# Patient Record
Sex: Female | Born: 1980 | Race: White | Hispanic: No | Marital: Single | Smoking: Former smoker
Health system: Southern US, Community
[De-identification: ages and names within clinical notes are randomized; demographics above are authoritative.]

## PROBLEM LIST (undated history)

## (undated) DIAGNOSIS — F419 Anxiety disorder, unspecified: Secondary | ICD-10-CM

## (undated) DIAGNOSIS — F1910 Other psychoactive substance abuse, uncomplicated: Secondary | ICD-10-CM

## (undated) DIAGNOSIS — R630 Anorexia: Secondary | ICD-10-CM

## (undated) DIAGNOSIS — K219 Gastro-esophageal reflux disease without esophagitis: Secondary | ICD-10-CM

## (undated) DIAGNOSIS — E701 Other hyperphenylalaninemias: Secondary | ICD-10-CM

## (undated) DIAGNOSIS — R519 Headache, unspecified: Secondary | ICD-10-CM

## (undated) DIAGNOSIS — E700 Classical phenylketonuria: Secondary | ICD-10-CM

## (undated) DIAGNOSIS — N12 Tubulo-interstitial nephritis, not specified as acute or chronic: Secondary | ICD-10-CM

## (undated) DIAGNOSIS — F418 Other specified anxiety disorders: Secondary | ICD-10-CM

## (undated) DIAGNOSIS — B019 Varicella without complication: Secondary | ICD-10-CM

## (undated) DIAGNOSIS — F32A Depression, unspecified: Secondary | ICD-10-CM

## (undated) DIAGNOSIS — F329 Major depressive disorder, single episode, unspecified: Secondary | ICD-10-CM

## (undated) DIAGNOSIS — N200 Calculus of kidney: Secondary | ICD-10-CM

## (undated) DIAGNOSIS — F319 Bipolar disorder, unspecified: Secondary | ICD-10-CM

## (undated) HISTORY — DX: Headache, unspecified: R51.9

## (undated) HISTORY — DX: Calculus of kidney: N20.0

## (undated) HISTORY — DX: Bipolar disorder, unspecified: F31.9

## (undated) HISTORY — DX: Depression, unspecified: F32.A

## (undated) HISTORY — DX: Other psychoactive substance abuse, uncomplicated: F19.10

## (undated) HISTORY — PX: WISDOM TOOTH EXTRACTION: SHX21

## (undated) HISTORY — DX: Tubulo-interstitial nephritis, not specified as acute or chronic: N12

## (undated) HISTORY — DX: Major depressive disorder, single episode, unspecified: F32.9

## (undated) HISTORY — DX: Anxiety disorder, unspecified: F41.9

## (undated) HISTORY — PX: NO PAST SURGERIES: SHX2092

## (undated) HISTORY — DX: Varicella without complication: B01.9

## (undated) HISTORY — DX: Gastro-esophageal reflux disease without esophagitis: K21.9

## (undated) HISTORY — DX: Anorexia: R63.0

## (undated) HISTORY — DX: Other specified anxiety disorders: F41.8

---
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
## 2001-09-24 ENCOUNTER — Other Ambulatory Visit: Admission: RE | Admit: 2001-09-24 | Discharge: 2001-09-24 | Payer: Self-pay | Admitting: Obstetrics and Gynecology

## 2002-01-05 ENCOUNTER — Other Ambulatory Visit: Admission: RE | Admit: 2002-01-05 | Discharge: 2002-01-05 | Payer: Self-pay | Admitting: Obstetrics and Gynecology

## 2002-10-27 ENCOUNTER — Other Ambulatory Visit: Admission: RE | Admit: 2002-10-27 | Discharge: 2002-10-27 | Payer: Self-pay | Admitting: Obstetrics and Gynecology

## 2003-12-08 ENCOUNTER — Emergency Department (HOSPITAL_COMMUNITY): Admission: EM | Admit: 2003-12-08 | Discharge: 2003-12-08 | Payer: Self-pay | Admitting: Family Medicine

## 2003-12-09 ENCOUNTER — Encounter: Admission: RE | Admit: 2003-12-09 | Discharge: 2003-12-09 | Payer: Self-pay | Admitting: Occupational Medicine

## 2006-12-11 ENCOUNTER — Ambulatory Visit: Payer: Self-pay | Admitting: Nurse Practitioner

## 2006-12-11 ENCOUNTER — Ambulatory Visit: Payer: Self-pay | Admitting: Family Medicine

## 2007-01-27 ENCOUNTER — Ambulatory Visit: Payer: Self-pay | Admitting: Family Medicine

## 2007-01-28 ENCOUNTER — Encounter (INDEPENDENT_AMBULATORY_CARE_PROVIDER_SITE_OTHER): Payer: Self-pay | Admitting: Family Medicine

## 2007-01-28 ENCOUNTER — Ambulatory Visit: Payer: Self-pay | Admitting: Internal Medicine

## 2007-01-28 ENCOUNTER — Ambulatory Visit: Payer: Self-pay | Admitting: *Deleted

## 2007-02-13 ENCOUNTER — Ambulatory Visit: Payer: Self-pay | Admitting: Internal Medicine

## 2007-02-24 ENCOUNTER — Ambulatory Visit: Payer: Self-pay | Admitting: Family Medicine

## 2007-03-17 ENCOUNTER — Ambulatory Visit: Payer: Self-pay | Admitting: Family Medicine

## 2007-04-16 ENCOUNTER — Ambulatory Visit: Payer: Self-pay | Admitting: Internal Medicine

## 2007-07-07 ENCOUNTER — Emergency Department (HOSPITAL_COMMUNITY): Admission: EM | Admit: 2007-07-07 | Discharge: 2007-07-07 | Payer: Self-pay | Admitting: Emergency Medicine

## 2007-09-30 ENCOUNTER — Ambulatory Visit (HOSPITAL_COMMUNITY): Admission: RE | Admit: 2007-09-30 | Discharge: 2007-09-30 | Payer: Self-pay | Admitting: Obstetrics and Gynecology

## 2007-10-02 ENCOUNTER — Ambulatory Visit: Payer: Self-pay | Admitting: Obstetrics & Gynecology

## 2007-10-03 ENCOUNTER — Ambulatory Visit: Payer: Self-pay | Admitting: Obstetrics & Gynecology

## 2007-10-03 ENCOUNTER — Inpatient Hospital Stay (HOSPITAL_COMMUNITY): Admission: AD | Admit: 2007-10-03 | Discharge: 2007-10-04 | Payer: Self-pay | Admitting: Obstetrics and Gynecology

## 2007-10-04 ENCOUNTER — Inpatient Hospital Stay (HOSPITAL_COMMUNITY): Admission: AD | Admit: 2007-10-04 | Discharge: 2007-10-04 | Payer: Self-pay | Admitting: Family Medicine

## 2007-10-04 ENCOUNTER — Ambulatory Visit: Payer: Self-pay | Admitting: Advanced Practice Midwife

## 2007-10-05 ENCOUNTER — Inpatient Hospital Stay (HOSPITAL_COMMUNITY): Admission: AD | Admit: 2007-10-05 | Discharge: 2007-10-06 | Payer: Self-pay | Admitting: Obstetrics & Gynecology

## 2007-10-13 ENCOUNTER — Ambulatory Visit: Payer: Self-pay | Admitting: Obstetrics & Gynecology

## 2007-10-21 ENCOUNTER — Ambulatory Visit (HOSPITAL_COMMUNITY): Admission: RE | Admit: 2007-10-21 | Discharge: 2007-10-21 | Payer: Self-pay | Admitting: Obstetrics & Gynecology

## 2007-10-27 ENCOUNTER — Ambulatory Visit: Payer: Self-pay | Admitting: Family Medicine

## 2007-10-31 ENCOUNTER — Emergency Department (HOSPITAL_COMMUNITY): Admission: EM | Admit: 2007-10-31 | Discharge: 2007-10-31 | Payer: Self-pay | Admitting: Emergency Medicine

## 2007-11-05 ENCOUNTER — Ambulatory Visit (HOSPITAL_COMMUNITY): Admission: RE | Admit: 2007-11-05 | Discharge: 2007-11-05 | Payer: Self-pay | Admitting: Obstetrics & Gynecology

## 2007-11-10 ENCOUNTER — Ambulatory Visit: Payer: Self-pay | Admitting: Obstetrics & Gynecology

## 2007-11-16 ENCOUNTER — Inpatient Hospital Stay (HOSPITAL_COMMUNITY): Admission: AD | Admit: 2007-11-16 | Discharge: 2007-11-16 | Payer: Self-pay | Admitting: Obstetrics & Gynecology

## 2007-11-17 ENCOUNTER — Ambulatory Visit: Payer: Self-pay | Admitting: Family Medicine

## 2007-11-18 ENCOUNTER — Encounter: Payer: Self-pay | Admitting: Family Medicine

## 2007-11-18 LAB — CONVERTED CEMR LAB
Chlamydia, DNA Probe: NEGATIVE
GC Probe Amp, Genital: NEGATIVE

## 2007-11-24 ENCOUNTER — Ambulatory Visit: Payer: Self-pay | Admitting: Obstetrics & Gynecology

## 2007-11-25 ENCOUNTER — Emergency Department (HOSPITAL_COMMUNITY): Admission: EM | Admit: 2007-11-25 | Discharge: 2007-11-25 | Payer: Self-pay | Admitting: Emergency Medicine

## 2007-11-26 ENCOUNTER — Ambulatory Visit (HOSPITAL_COMMUNITY): Admission: RE | Admit: 2007-11-26 | Discharge: 2007-11-26 | Payer: Self-pay | Admitting: Obstetrics & Gynecology

## 2007-11-28 ENCOUNTER — Ambulatory Visit (HOSPITAL_COMMUNITY): Admission: RE | Admit: 2007-11-28 | Discharge: 2007-11-28 | Payer: Self-pay | Admitting: Obstetrics & Gynecology

## 2007-12-01 ENCOUNTER — Ambulatory Visit: Payer: Self-pay | Admitting: Obstetrics & Gynecology

## 2007-12-05 ENCOUNTER — Ambulatory Visit: Payer: Self-pay | Admitting: Obstetrics & Gynecology

## 2007-12-08 ENCOUNTER — Ambulatory Visit: Payer: Self-pay | Admitting: Obstetrics and Gynecology

## 2007-12-08 ENCOUNTER — Inpatient Hospital Stay (HOSPITAL_COMMUNITY): Admission: RE | Admit: 2007-12-08 | Discharge: 2007-12-10 | Payer: Self-pay | Admitting: Obstetrics & Gynecology

## 2007-12-16 ENCOUNTER — Ambulatory Visit: Payer: Self-pay | Admitting: Family Medicine

## 2007-12-16 ENCOUNTER — Inpatient Hospital Stay (HOSPITAL_COMMUNITY): Admission: AD | Admit: 2007-12-16 | Discharge: 2007-12-16 | Payer: Self-pay | Admitting: Obstetrics & Gynecology

## 2008-05-11 ENCOUNTER — Emergency Department (HOSPITAL_BASED_OUTPATIENT_CLINIC_OR_DEPARTMENT_OTHER): Admission: EM | Admit: 2008-05-11 | Discharge: 2008-05-12 | Payer: Self-pay | Admitting: Emergency Medicine

## 2009-02-12 ENCOUNTER — Emergency Department (HOSPITAL_COMMUNITY): Admission: EM | Admit: 2009-02-12 | Discharge: 2009-02-12 | Payer: Self-pay | Admitting: Family Medicine

## 2009-12-01 ENCOUNTER — Emergency Department (HOSPITAL_COMMUNITY): Admission: EM | Admit: 2009-12-01 | Discharge: 2009-12-02 | Payer: Self-pay | Admitting: Emergency Medicine

## 2010-02-18 ENCOUNTER — Encounter: Payer: Self-pay | Admitting: Internal Medicine

## 2010-02-19 ENCOUNTER — Encounter: Payer: Self-pay | Admitting: *Deleted

## 2010-03-13 ENCOUNTER — Emergency Department (HOSPITAL_BASED_OUTPATIENT_CLINIC_OR_DEPARTMENT_OTHER)
Admission: EM | Admit: 2010-03-13 | Discharge: 2010-03-13 | Disposition: A | Discharge status: Home / Self Care | Payer: Self-pay | Attending: Emergency Medicine | Admitting: Emergency Medicine

## 2010-03-13 DIAGNOSIS — B9689 Other specified bacterial agents as the cause of diseases classified elsewhere: Secondary | ICD-10-CM | POA: Insufficient documentation

## 2010-03-13 DIAGNOSIS — N760 Acute vaginitis: Secondary | ICD-10-CM | POA: Insufficient documentation

## 2010-03-13 DIAGNOSIS — F17200 Nicotine dependence, unspecified, uncomplicated: Secondary | ICD-10-CM | POA: Insufficient documentation

## 2010-03-13 DIAGNOSIS — A499 Bacterial infection, unspecified: Secondary | ICD-10-CM | POA: Insufficient documentation

## 2010-03-13 LAB — URINALYSIS, ROUTINE W REFLEX MICROSCOPIC
Bilirubin Urine: NEGATIVE
Hgb urine dipstick: NEGATIVE
Ketones, ur: NEGATIVE mg/dL
Nitrite: NEGATIVE
Protein, ur: NEGATIVE mg/dL
Specific Gravity, Urine: 1.007 (ref 1.005–1.030)
Urine Glucose, Fasting: NEGATIVE mg/dL
Urobilinogen, UA: 0.2 mg/dL (ref 0.0–1.0)
pH: 7 (ref 5.0–8.0)

## 2010-03-13 LAB — WET PREP, GENITAL
Trich, Wet Prep: NONE SEEN
Yeast Wet Prep HPF POC: NONE SEEN

## 2010-03-13 LAB — URINE MICROSCOPIC-ADD ON

## 2010-03-13 LAB — PREGNANCY, URINE: Preg Test, Ur: NEGATIVE

## 2010-03-15 LAB — GC/CHLAMYDIA PROBE AMP, GENITAL
Chlamydia, DNA Probe: POSITIVE — AB
GC Probe Amp, Genital: NEGATIVE

## 2010-04-11 LAB — RAPID URINE DRUG SCREEN, HOSP PERFORMED
Amphetamines: NOT DETECTED
Barbiturates: NOT DETECTED
Benzodiazepines: NOT DETECTED
Cocaine: NOT DETECTED
Opiates: NOT DETECTED
Tetrahydrocannabinol: NOT DETECTED

## 2010-04-11 LAB — HEPATIC FUNCTION PANEL
ALT: 24 U/L (ref 0–35)
Alkaline Phosphatase: 64 U/L (ref 39–117)
Bilirubin, Direct: <0.1 mg/dL (ref 0.0–0.3)

## 2010-04-11 LAB — BASIC METABOLIC PANEL
BUN: 9 mg/dL (ref 6–23)
CO2: 24 mEq/L (ref 19–32)
Calcium: 9.3 mg/dL (ref 8.4–10.5)
Chloride: 113 mEq/L — ABNORMAL HIGH (ref 96–112)
Creatinine, Ser: 0.68 mg/dL (ref 0.4–1.2)
GFR calc Af Amer: >60 mL/min (ref >60)
GFR calc non Af Amer: >60 mL/min (ref >60)
Glucose, Bld: 58 mg/dL — ABNORMAL LOW (ref 70–99)
Potassium: 3.3 mEq/L — ABNORMAL LOW (ref 3.5–5.1)
Sodium: 145 mEq/L (ref 135–145)

## 2010-04-11 LAB — POCT PREGNANCY, URINE: Preg Test, Ur: NEGATIVE

## 2010-04-11 LAB — URINALYSIS, ROUTINE W REFLEX MICROSCOPIC
Bilirubin Urine: NEGATIVE
Glucose, UA: NEGATIVE mg/dL
Ketones, ur: NEGATIVE mg/dL
Leukocytes, UA: NEGATIVE
Nitrite: NEGATIVE
Protein, ur: NEGATIVE mg/dL
Specific Gravity, Urine: 1.007 (ref 1.005–1.030)
Urobilinogen, UA: 0.2 mg/dL (ref 0.0–1.0)
pH: 6.5 (ref 5.0–8.0)

## 2010-04-11 LAB — CBC
HCT: 39.3 % (ref 36.0–46.0)
MCH: 32.3 pg (ref 26.0–34.0)
MCV: 92.3 fL (ref 78.0–100.0)
RDW: 11.8 % (ref 11.5–15.5)
WBC: 13.8 10*3/uL — ABNORMAL HIGH (ref 4.0–10.5)

## 2010-04-11 LAB — DIFFERENTIAL
Basophils Absolute: 0 10*3/uL (ref 0.0–0.1)
Eosinophils Absolute: 0 10*3/uL (ref 0.0–0.7)
Eosinophils Relative: 0 % (ref 0–5)
Lymphocytes Relative: 11 % — ABNORMAL LOW (ref 12–46)
Lymphs Abs: 1.5 10*3/uL (ref 0.7–4.0)
Monocytes Absolute: 0.8 10*3/uL (ref 0.1–1.0)

## 2010-04-11 LAB — URINE MICROSCOPIC-ADD ON

## 2010-04-11 LAB — TRICYCLICS SCREEN, URINE: TCA Scrn: NOT DETECTED

## 2010-04-11 LAB — ETHANOL: Alcohol, Ethyl (B): 46 mg/dL — ABNORMAL HIGH (ref 0–10)

## 2010-10-30 LAB — POCT URINALYSIS DIP (DEVICE)
Hgb urine dipstick: NEGATIVE
Nitrite: NEGATIVE
Urobilinogen, UA: 0.2
pH: 8

## 2010-10-31 LAB — URINALYSIS, ROUTINE W REFLEX MICROSCOPIC
Ketones, ur: NEGATIVE
Nitrite: NEGATIVE
Protein, ur: NEGATIVE
Urobilinogen, UA: 0.2

## 2010-10-31 LAB — CBC
HCT: 33.4 — ABNORMAL LOW
Hemoglobin: 11.3 — ABNORMAL LOW
MCHC: 33.9
MCHC: 34.5
MCV: 92.7
MCV: 93.3
Platelets: 136 — ABNORMAL LOW
RDW: 12.5
RDW: 12.7
WBC: 14.5 — ABNORMAL HIGH

## 2010-10-31 LAB — POCT URINALYSIS DIP (DEVICE)
Bilirubin Urine: NEGATIVE
Bilirubin Urine: NEGATIVE
Bilirubin Urine: NEGATIVE
Hgb urine dipstick: NEGATIVE
Hgb urine dipstick: NEGATIVE
Hgb urine dipstick: NEGATIVE
Hgb urine dipstick: NEGATIVE
Nitrite: NEGATIVE
Nitrite: NEGATIVE
Nitrite: NEGATIVE
Nitrite: NEGATIVE
Protein, ur: NEGATIVE
Urobilinogen, UA: 0.2
Urobilinogen, UA: 0.2
pH: 7
pH: 7.5
pH: 7.5
pH: 7

## 2010-11-01 LAB — POCT URINALYSIS DIP (DEVICE)
Bilirubin Urine: NEGATIVE
Bilirubin Urine: NEGATIVE
Glucose, UA: NEGATIVE
Hgb urine dipstick: NEGATIVE
Ketones, ur: NEGATIVE
Nitrite: NEGATIVE
Operator id: 297281
Protein, ur: NEGATIVE
Specific Gravity, Urine: 1.01
Urobilinogen, UA: 0.2
pH: 8

## 2010-11-01 LAB — STREP B DNA PROBE

## 2010-11-01 LAB — URINALYSIS, ROUTINE W REFLEX MICROSCOPIC
Glucose, UA: NEGATIVE
Hgb urine dipstick: NEGATIVE
Specific Gravity, Urine: <1.005 — ABNORMAL LOW
Urobilinogen, UA: 0.2

## 2010-11-01 LAB — WET PREP, GENITAL
Trich, Wet Prep: NONE SEEN
Yeast Wet Prep HPF POC: NONE SEEN

## 2010-11-01 LAB — RAPID STREP SCREEN (MED CTR MEBANE ONLY): Streptococcus, Group A Screen (Direct): NEGATIVE

## 2014-01-06 ENCOUNTER — Emergency Department (HOSPITAL_BASED_OUTPATIENT_CLINIC_OR_DEPARTMENT_OTHER)
Admission: EM | Admit: 2014-01-06 | Discharge: 2014-01-06 | Disposition: A | Discharge status: Home / Self Care | Payer: Commercial Indemnity | Attending: Emergency Medicine | Admitting: Emergency Medicine

## 2014-01-06 ENCOUNTER — Encounter (HOSPITAL_BASED_OUTPATIENT_CLINIC_OR_DEPARTMENT_OTHER): Payer: Self-pay

## 2014-01-06 DIAGNOSIS — Z79899 Other long term (current) drug therapy: Secondary | ICD-10-CM | POA: Insufficient documentation

## 2014-01-06 DIAGNOSIS — J029 Acute pharyngitis, unspecified: Secondary | ICD-10-CM | POA: Diagnosis not present

## 2014-01-06 DIAGNOSIS — R5383 Other fatigue: Secondary | ICD-10-CM | POA: Diagnosis not present

## 2014-01-06 DIAGNOSIS — Z8639 Personal history of other endocrine, nutritional and metabolic disease: Secondary | ICD-10-CM | POA: Diagnosis not present

## 2014-01-06 DIAGNOSIS — R05 Cough: Secondary | ICD-10-CM | POA: Diagnosis present

## 2014-01-06 HISTORY — DX: Classical phenylketonuria: E70.0

## 2014-01-06 HISTORY — DX: Other hyperphenylalaninemias: E70.1

## 2014-01-06 LAB — RAPID STREP SCREEN (MED CTR MEBANE ONLY): Streptococcus, Group A Screen (Direct): NEGATIVE

## 2014-01-06 MED ORDER — HYDROCODONE-ACETAMINOPHEN 7.5-325 MG/15ML PO SOLN: 15.0000 mL | Freq: Three times a day (TID) | ORAL | Status: DC | PRN

## 2014-01-06 MED ORDER — NAPROXEN 500 MG PO TABS: 500.0000 mg | ORAL_TABLET | Freq: Two times a day (BID) | ORAL | Status: DC

## 2014-01-06 NOTE — ED Provider Notes (Signed)
CSN: [id number]     Arrival date & time 01/06/14  1112 History   First MD Initiated Contact with Patient 01/06/14 1125     Chief Complaint  Patient presents with  . URI     (Consider location/radiation/quality/duration/timing/severity/associated sxs/prior Treatment) HPI Comments: Gradual in onset, persistent for sore throat for several days. Denies nausea vomiting abdominal pain back pain dysuria diarrhea or swelling. She has associated sore throat, minimal cough, feeling extremely fatigued and sleepy. Has been using ibuprofen with minimal improvement.  Patient is a 33 y.o. female presenting with URI. The history is provided by the patient.  URI   Past Medical History  Diagnosis Date  . PKU (phenylketonuria)    History reviewed. No pertinent past surgical history. No family history on file. History  Substance Use Topics  . Smoking status: Never Smoker   . Smokeless tobacco: Not on file  . Alcohol Use: No   OB History    No data available     Review of Systems  All other systems reviewed and are negative.     Allergies  Review of patient's allergies indicates no known allergies.  Home Medications   Prior to Admission medications   Medication Sig Start Date End Date Taking? Authorizing Provider  BuPROPion HCl (WELLBUTRIN PO) Take by mouth.   Yes Historical Provider, MD  HYDROcodone-acetaminophen (HYCET) 7.5-325 mg/15 ml solution Take 15 mLs by mouth every 8 (eight) hours as needed for moderate pain. 01/06/14   Johnna Acosta, MD  naproxen (NAPROSYN) 500 MG tablet Take 1 tablet (500 mg total) by mouth 2 (two) times daily with a meal. 01/06/14   Johnna Acosta, MD   BP 112/71 mmHg  Pulse 84  Temp(Src) 98.4 F (36.9 C) (Oral)  Resp 16  Ht 5' 2" (1.575 m)  Wt 97 lb (43.999 kg)  BMI 17.74 kg/m2  SpO2 100%  LMP 12/17/2013 Physical Exam  Constitutional: She appears well-developed and well-nourished. No distress.  HENT:  Head: Normocephalic and atraumatic.   Mouth/Throat: Oropharynx is clear and moist. No oropharyngeal exudate.  Pharynx is nonerythematous, no exudate, no asymmetry, no swelling, uvula midline, mucous members moist  Eyes: Conjunctivae and EOM are normal. Pupils are equal, round, and reactive to light. Right eye exhibits no discharge. Left eye exhibits no discharge. No scleral icterus.  Neck: Normal range of motion. Neck supple. No JVD present. No thyromegaly present.  No cervical lymphadenopathy, no trismus or torticollis  Cardiovascular: Normal rate, regular rhythm, normal heart sounds and intact distal pulses.  Exam reveals no gallop and no friction rub.   No murmur heard. Pulmonary/Chest: Effort normal and breath sounds normal. No respiratory distress. She has no wheezes. She has no rales.  Abdominal: Soft. Bowel sounds are normal. She exhibits no distension and no mass. There is no tenderness.  No hepatosplenomegaly  Musculoskeletal: Normal range of motion. She exhibits no edema or tenderness.  Lymphadenopathy:    She has no cervical adenopathy.  Neurological: She is alert. Coordination normal.  Skin: Skin is warm and dry. No rash noted. No erythema.  Psychiatric: She has a normal mood and affect. Her behavior is normal.  Nursing note and vitals reviewed.   ED Course  Procedures (including critical care time) Labs Review Labs Reviewed  RAPID STREP SCREEN  CULTURE, GROUP A STREP    Imaging Review No results found.    MDM   Final diagnoses:  Pharyngitis    Overall well-appearing, vital signs normal, check for strep, otherwise likely  upper respiratory infection of viral etiology and can be discharged home in stable condition. I recommended Motrin during the day, liquid Vicodin at night as needed for significant pain. Patient is in agreement.  Strep neg  Meds given in ED:  Medications - No data to display  New Prescriptions   HYDROCODONE-ACETAMINOPHEN (HYCET) 7.5-325 MG/15 ML SOLUTION    Take 15 mLs by  mouth every 8 (eight) hours as needed for moderate pain.   NAPROXEN (NAPROSYN) 500 MG TABLET    Take 1 tablet (500 mg total) by mouth 2 (two) times daily with a meal.        Johnna Acosta, MD 01/06/14 [phone]

## 2014-01-06 NOTE — ED Notes (Signed)
C/o head congestion, sore throat, fatigue x 3 days

## 2014-01-06 NOTE — Discharge Instructions (Signed)
Try Chloraseptic spray as needed before meals  Hydrocodone suspension for severe pain at night, ibuprofen or Naprosyn during the day.  Strep test negative  Please call your doctor for a followup appointment within 24-48 hours. When you talk to your doctor please let them know that you were seen in the emergency department and have them acquire all of your records so that they can discuss the findings with you and formulate a treatment plan to fully care for your new and ongoing problems.

## 2014-01-06 NOTE — ED Notes (Addendum)
Directed to pharmacy to pick up Rx- worknote given

## 2014-01-06 NOTE — ED Notes (Signed)
MD at bedside.

## 2014-01-08 LAB — CULTURE, GROUP A STREP

## 2015-11-16 ENCOUNTER — Emergency Department (HOSPITAL_BASED_OUTPATIENT_CLINIC_OR_DEPARTMENT_OTHER): Payer: No Typology Code available for payment source

## 2015-11-16 ENCOUNTER — Encounter (HOSPITAL_BASED_OUTPATIENT_CLINIC_OR_DEPARTMENT_OTHER): Payer: Self-pay | Admitting: *Deleted

## 2015-11-16 ENCOUNTER — Emergency Department (HOSPITAL_BASED_OUTPATIENT_CLINIC_OR_DEPARTMENT_OTHER)
Admission: EM | Admit: 2015-11-16 | Discharge: 2015-11-16 | Disposition: A | Discharge status: Home / Self Care | Payer: No Typology Code available for payment source | Attending: Emergency Medicine | Admitting: Emergency Medicine

## 2015-11-16 DIAGNOSIS — Y92410 Unspecified street and highway as the place of occurrence of the external cause: Secondary | ICD-10-CM | POA: Insufficient documentation

## 2015-11-16 DIAGNOSIS — Y9389 Activity, other specified: Secondary | ICD-10-CM | POA: Diagnosis not present

## 2015-11-16 DIAGNOSIS — S199XXA Unspecified injury of neck, initial encounter: Secondary | ICD-10-CM | POA: Diagnosis present

## 2015-11-16 DIAGNOSIS — Y999 Unspecified external cause status: Secondary | ICD-10-CM | POA: Insufficient documentation

## 2015-11-16 DIAGNOSIS — S161XXA Strain of muscle, fascia and tendon at neck level, initial encounter: Secondary | ICD-10-CM | POA: Diagnosis not present

## 2015-11-16 MED ORDER — IBUPROFEN 800 MG PO TABS: 800.0000 mg | ORAL_TABLET | Freq: Three times a day (TID) | ORAL | 0 refills | Status: DC

## 2015-11-16 MED ORDER — METHOCARBAMOL 500 MG PO TABS
500.0000 mg | ORAL_TABLET | Freq: Four times a day (QID) | ORAL | 0 refills | Status: DC
Start: 2015-11-16 — End: 2015-12-13

## 2015-11-16 MED ORDER — ACETAMINOPHEN 325 MG PO TABS
ORAL_TABLET | ORAL | Status: AC
  Filled 2015-11-16: qty 2

## 2015-11-16 MED ORDER — ACETAMINOPHEN 325 MG PO TABS
650.0000 mg | ORAL_TABLET | Freq: Once | ORAL | Status: AC
  Administered 2015-11-16: 650 mg via ORAL

## 2015-11-16 MED FILL — METHOCARBAMOL 500 MG TABLET: 500 | 5 days supply | Qty: 20 | Fill #0

## 2015-11-16 MED FILL — IBUPROFEN 800 MG TABLET: 800 | 7 days supply | Qty: 21 | Fill #0
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
## 2015-11-16 NOTE — Discharge Instructions (Signed)
See your Physician for recheck in one week if pain persist

## 2015-11-28 ENCOUNTER — Encounter: Payer: Self-pay | Admitting: Family Medicine

## 2015-11-28 DIAGNOSIS — E701 Other hyperphenylalaninemias: Secondary | ICD-10-CM | POA: Insufficient documentation

## 2015-11-28 DIAGNOSIS — E700 Classical phenylketonuria: Secondary | ICD-10-CM | POA: Insufficient documentation

## 2015-11-28 DIAGNOSIS — F418 Other specified anxiety disorders: Secondary | ICD-10-CM | POA: Insufficient documentation

## 2015-11-28 DIAGNOSIS — F319 Bipolar disorder, unspecified: Secondary | ICD-10-CM | POA: Insufficient documentation

## 2015-11-29 ENCOUNTER — Encounter: Payer: Self-pay | Admitting: Family Medicine

## 2015-11-29 ENCOUNTER — Ambulatory Visit (INDEPENDENT_AMBULATORY_CARE_PROVIDER_SITE_OTHER): Payer: Managed Care, Other (non HMO) | Admitting: Family Medicine

## 2015-11-29 VITALS — BP 99/63 | HR 77 | Temp 98.2°F | Resp 20 | Ht 62.5 in | Wt 104.0 lb

## 2015-11-29 DIAGNOSIS — G44209 Tension-type headache, unspecified, not intractable: Secondary | ICD-10-CM | POA: Diagnosis not present

## 2015-11-29 DIAGNOSIS — Z7689 Persons encountering health services in other specified circumstances: Secondary | ICD-10-CM

## 2015-11-29 DIAGNOSIS — M5442 Lumbago with sciatica, left side: Secondary | ICD-10-CM | POA: Diagnosis not present

## 2015-11-29 MED ORDER — METHYLPREDNISOLONE ACETATE 40 MG/ML IJ SUSP
40.0000 mg | Freq: Once | INTRAMUSCULAR | Status: AC
  Administered 2015-11-29: 40 mg via INTRAMUSCULAR

## 2015-11-29 MED ORDER — NAPROXEN 500 MG PO TABS: 500.0000 mg | ORAL_TABLET | Freq: Two times a day (BID) | ORAL | 0 refills | Status: DC

## 2015-11-29 MED ORDER — CYCLOBENZAPRINE HCL 5 MG PO TABS: 5.0000 mg | ORAL_TABLET | Freq: Two times a day (BID) | ORAL | 0 refills | Status: DC | PRN
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
## 2015-11-29 NOTE — Patient Instructions (Signed)
- use a heat pad to neck and back (with caution - never more than 15 minutes and never directly on skin).  - Start tomorrow.- naproxen every 12 hours with meal for 5-7 days. Then only as needed for pain. - Flexeril at night for 5-7 nights, you can take it during the day (one additional time) if not sedating. Otherwise please use the robaxin in the day.  - Rest, start stretches slowly daily, then start to return to some exercise in 1 week, but not if it causes you pain. - follow up 2 weeks.    Sciatica With Rehab The sciatic nerve runs from the back down the leg and is responsible for sensation and control of the muscles in the back (posterior) side of the thigh, lower leg, and foot. Sciatica is a condition that is characterized by inflammation of this nerve.  SYMPTOMS   Signs of nerve damage, including numbness and/or weakness along the posterior side of the lower extremity.  Pain in the back of the thigh that may also travel down the leg.  Pain that worsens when sitting for long periods of time.  Occasionally, pain in the back or buttock. CAUSES  Inflammation of the sciatic nerve is the cause of sciatica. The inflammation is due to something irritating the nerve. Common sources of irritation include:  Sitting for long periods of time.  Direct trauma to the nerve.  Arthritis of the spine.  Herniated or ruptured disk.  Slipping of the vertebrae (spondylolisthesis).  Pressure from soft tissues, such as muscles or ligament-like tissue (fascia). RISK INCREASES WITH:  Sports that place pressure or stress on the spine (football or weightlifting).  Poor strength and flexibility.  Failure to warm up properly before activity.  Family history of low back pain or disk disorders.  Previous back injury or surgery.  Poor body mechanics, especially when lifting, or poor posture. PREVENTION   Warm up and stretch properly before activity.  Maintain physical fitness:  Strength,  flexibility, and endurance.  Cardiovascular fitness.  Learn and use proper technique, especially with posture and lifting. When possible, have coach correct improper technique.  Avoid activities that place stress on the spine. PROGNOSIS If treated properly, then sciatica usually resolves within 6 weeks. However, occasionally surgery is necessary.  RELATED COMPLICATIONS   Permanent nerve damage, including pain, numbness, tingle, or weakness.  Chronic back pain.  Risks of surgery: infection, bleeding, nerve damage, or damage to surrounding tissues. TREATMENT Treatment initially involves resting from any activities that aggravate your symptoms. The use of ice and medication may help reduce pain and inflammation. The use of strengthening and stretching exercises may help reduce pain with activity. These exercises may be performed at home or with referral to a therapist. A therapist may recommend further treatments, such as transcutaneous electronic nerve stimulation (TENS) or ultrasound. Your caregiver may recommend corticosteroid injections to help reduce inflammation of the sciatic nerve. If symptoms persist despite non-surgical (conservative) treatment, then surgery may be recommended. MEDICATION  If pain medication is necessary, then nonsteroidal anti-inflammatory medications, such as aspirin and ibuprofen, or other minor pain relievers, such as acetaminophen, are often recommended.  Do not take pain medication for 7 days before surgery.  Prescription pain relievers may be given if deemed necessary by your caregiver. Use only as directed and only as much as you need.  Ointments applied to the skin may be helpful.  Corticosteroid injections may be given by your caregiver. These injections should be reserved for the most  serious cases, because they may only be given a certain number of times. HEAT AND COLD  Cold treatment (icing) relieves pain and reduces inflammation. Cold treatment  should be applied for 10 to 15 minutes every 2 to 3 hours for inflammation and pain and immediately after any activity that aggravates your symptoms. Use ice packs or massage the area with a piece of ice (ice massage).  Heat treatment may be used prior to performing the stretching and strengthening activities prescribed by your caregiver, physical therapist, or athletic trainer. Use a heat pack or soak the injury in warm water. SEEK MEDICAL CARE IF:  Treatment seems to offer no benefit, or the condition worsens.  Any medications produce adverse side effects. EXERCISES  RANGE OF MOTION (ROM) AND STRETCHING EXERCISES - Sciatica Most people with sciatic will find that their symptoms worsen with either excessive bending forward (flexion) or arching at the low back (extension). The exercises which will help resolve your symptoms will focus on the opposite motion. Your physician, physical therapist or athletic trainer will help you determine which exercises will be most helpful to resolve your low back pain. Do not complete any exercises without first consulting with your clinician. Discontinue any exercises which worsen your symptoms until you speak to your clinician. If you have pain, numbness or tingling which travels down into your buttocks, leg or foot, the goal of the therapy is for these symptoms to move closer to your back and eventually resolve. Occasionally, these leg symptoms will get better, but your low back pain may worsen; this is typically an indication of progress in your rehabilitation. Be certain to be very alert to any changes in your symptoms and the activities in which you participated in the 24 hours prior to the change. Sharing this information with your clinician will allow him/her to most efficiently treat your condition. These exercises may help you when beginning to rehabilitate your injury. Your symptoms may resolve with or without further involvement from your physician, physical  therapist or athletic trainer. While completing these exercises, remember:   Restoring tissue flexibility helps normal motion to return to the joints. This allows healthier, less painful movement and activity.  An effective stretch should be held for at least 30 seconds.  A stretch should never be painful. You should only feel a gentle lengthening or release in the stretched tissue. FLEXION RANGE OF MOTION AND STRETCHING EXERCISES: STRETCH - Flexion, Single Knee to Chest   Lie on a firm bed or floor with both legs extended in front of you.  Keeping one leg in contact with the floor, bring your opposite knee to your chest. Hold your leg in place by either grabbing behind your thigh or at your knee.  Pull until you feel a gentle stretch in your low back. Hold __________ seconds.  Slowly release your grasp and repeat the exercise with the opposite side. Repeat __________ times. Complete this exercise __________ times per day.  STRETCH - Flexion, Double Knee to Chest  Lie on a firm bed or floor with both legs extended in front of you.  Keeping one leg in contact with the floor, bring your opposite knee to your chest.  Tense your stomach muscles to support your back and then lift your other knee to your chest. Hold your legs in place by either grabbing behind your thighs or at your knees.  Pull both knees toward your chest until you feel a gentle stretch in your low back. Hold __________ seconds.  Tense your stomach muscles and slowly return one leg at a time to the floor. Repeat __________ times. Complete this exercise __________ times per day.  STRETCH - Low Trunk Rotation   Lie on a firm bed or floor. Keeping your legs in front of you, bend your knees so they are both pointed toward the ceiling and your feet are flat on the floor.  Extend your arms out to the side. This will stabilize your upper body by keeping your shoulders in contact with the floor.  Gently and slowly drop both  knees together to one side until you feel a gentle stretch in your low back. Hold for __________ seconds.  Tense your stomach muscles to support your low back as you bring your knees back to the starting position. Repeat the exercise to the other side. Repeat __________ times. Complete this exercise __________ times per day  EXTENSION RANGE OF MOTION AND FLEXIBILITY EXERCISES: STRETCH - Extension, Prone on Elbows  Lie on your stomach on the floor, a bed will be too soft. Place your palms about shoulder width apart and at the height of your head.  Place your elbows under your shoulders. If this is too painful, stack pillows under your chest.  Allow your body to relax so that your hips drop lower and make contact more completely with the floor.  Hold this position for __________ seconds.  Slowly return to lying flat on the floor. Repeat __________ times. Complete this exercise __________ times per day.  RANGE OF MOTION - Extension, Prone Press Ups  Lie on your stomach on the floor, a bed will be too soft. Place your palms about shoulder width apart and at the height of your head.  Keeping your back as relaxed as possible, slowly straighten your elbows while keeping your hips on the floor. You may adjust the placement of your hands to maximize your comfort. As you gain motion, your hands will come more underneath your shoulders.  Hold this position __________ seconds.  Slowly return to lying flat on the floor. Repeat __________ times. Complete this exercise __________ times per day.  STRENGTHENING EXERCISES - Sciatica  These exercises may help you when beginning to rehabilitate your injury. These exercises should be done near your "sweet spot." This is the neutral, low-back arch, somewhere between fully rounded and fully arched, that is your least painful position. When performed in this safe range of motion, these exercises can be used for people who have either a flexion or extension based  injury. These exercises may resolve your symptoms with or without further involvement from your physician, physical therapist or athletic trainer. While completing these exercises, remember:   Muscles can gain both the endurance and the strength needed for everyday activities through controlled exercises.  Complete these exercises as instructed by your physician, physical therapist or athletic trainer. Progress with the resistance and repetition exercises only as your caregiver advises.  You may experience muscle soreness or fatigue, but the pain or discomfort you are trying to eliminate should never worsen during these exercises. If this pain does worsen, stop and make certain you are following the directions exactly. If the pain is still present after adjustments, discontinue the exercise until you can discuss the trouble with your clinician. STRENGTHENING - Deep Abdominals, Pelvic Tilt   Lie on a firm bed or floor. Keeping your legs in front of you, bend your knees so they are both pointed toward the ceiling and your feet are flat on the floor.  Tense your lower abdominal muscles to press your low back into the floor. This motion will rotate your pelvis so that your tail bone is scooping upwards rather than pointing at your feet or into the floor.  With a gentle tension and even breathing, hold this position for __________ seconds. Repeat __________ times. Complete this exercise __________ times per day.  STRENGTHENING - Abdominals, Crunches   Lie on a firm bed or floor. Keeping your legs in front of you, bend your knees so they are both pointed toward the ceiling and your feet are flat on the floor. Cross your arms over your chest.  Slightly tip your chin down without bending your neck.  Tense your abdominals and slowly lift your trunk high enough to just clear your shoulder blades. Lifting higher can put excessive stress on the low back and does not further strengthen your abdominal  muscles.  Control your return to the starting position. Repeat __________ times. Complete this exercise __________ times per day.  STRENGTHENING - Quadruped, Opposite UE/LE Lift  Assume a hands and knees position on a firm surface. Keep your hands under your shoulders and your knees under your hips. You may place padding under your knees for comfort.  Find your neutral spine and gently tense your abdominal muscles so that you can maintain this position. Your shoulders and hips should form a rectangle that is parallel with the floor and is not twisted.  Keeping your trunk steady, lift your right hand no higher than your shoulder and then your left leg no higher than your hip. Make sure you are not holding your breath. Hold this position __________ seconds.  Continuing to keep your abdominal muscles tense and your back steady, slowly return to your starting position. Repeat with the opposite arm and leg. Repeat __________ times. Complete this exercise __________ times per day.  STRENGTHENING - Abdominals and Quadriceps, Straight Leg Raise   Lie on a firm bed or floor with both legs extended in front of you.  Keeping one leg in contact with the floor, bend the other knee so that your foot can rest flat on the floor.  Find your neutral spine, and tense your abdominal muscles to maintain your spinal position throughout the exercise.  Slowly lift your straight leg off the floor about 6 inches for a count of 15, making sure to not hold your breath.  Still keeping your neutral spine, slowly lower your leg all the way to the floor. Repeat this exercise with each leg __________ times. Complete this exercise __________ times per day. POSTURE AND BODY MECHANICS CONSIDERATIONS - Sciatica Keeping correct posture when sitting, standing or completing your activities will reduce the stress put on different body tissues, allowing injured tissues a chance to heal and limiting painful experiences. The  following are general guidelines for improved posture. Your physician or physical therapist will provide you with any instructions specific to your needs. While reading these guidelines, remember:  The exercises prescribed by your provider will help you have the flexibility and strength to maintain correct postures.  The correct posture provides the optimal environment for your joints to work. All of your joints have less wear and tear when properly supported by a spine with good posture. This means you will experience a healthier, less painful body.  Correct posture must be practiced with all of your activities, especially prolonged sitting and standing. Correct posture is as important when doing repetitive low-stress activities (typing) as it is when doing a single  heavy-load activity (lifting). RESTING POSITIONS Consider which positions are most painful for you when choosing a resting position. If you have pain with flexion-based activities (sitting, bending, stooping, squatting), choose a position that allows you to rest in a less flexed posture. You would want to avoid curling into a fetal position on your side. If your pain worsens with extension-based activities (prolonged standing, working overhead), avoid resting in an extended position such as sleeping on your stomach. Most people will find more comfort when they rest with their spine in a more neutral position, neither too rounded nor too arched. Lying on a non-sagging bed on your side with a pillow between your knees, or on your back with a pillow under your knees will often provide some relief. Keep in mind, being in any one position for a prolonged period of time, no matter how correct your posture, can still lead to stiffness. PROPER SITTING POSTURE In order to minimize stress and discomfort on your spine, you must sit with correct posture Sitting with good posture should be effortless for a healthy body. Returning to good posture is a  gradual process. Many people can work toward this most comfortably by using various supports until they have the flexibility and strength to maintain this posture on their own. When sitting with proper posture, your ears will fall over your shoulders and your shoulders will fall over your hips. You should use the back of the chair to support your upper back. Your low back will be in a neutral position, just slightly arched. You may place a small pillow or folded towel at the base of your low back for support.  When working at a desk, create an environment that supports good, upright posture. Without extra support, muscles fatigue and lead to excessive strain on joints and other tissues. Keep these recommendations in mind: CHAIR:   A chair should be able to slide under your desk when your back makes contact with the back of the chair. This allows you to work closely.  The chair's height should allow your eyes to be level with the upper part of your monitor and your hands to be slightly lower than your elbows. BODY POSITION  Your feet should make contact with the floor. If this is not possible, use a foot rest.  Keep your ears over your shoulders. This will reduce stress on your neck and low back. INCORRECT SITTING POSTURES   If you are feeling tired and unable to assume a healthy sitting posture, do not slouch or slump. This puts excessive strain on your back tissues, causing more damage and pain. Healthier options include:  Using more support, like a lumbar pillow.  Switching tasks to something that requires you to be upright or walking.  Talking a brief walk.  Lying down to rest in a neutral-spine position. PROLONGED STANDING WHILE SLIGHTLY LEANING FORWARD  When completing a task that requires you to lean forward while standing in one place for a long time, place either foot up on a stationary 2-4 inch high object to help maintain the best posture. When both feet are on the ground, the low  back tends to lose its slight inward curve. If this curve flattens (or becomes too large), then the back and your other joints will experience too much stress, fatigue more quickly and can cause pain.  CORRECT STANDING POSTURES Proper standing posture should be assumed with all daily activities, even if they only take a few moments, like when brushing your  teeth. As in sitting, your ears should fall over your shoulders and your shoulders should fall over your hips. You should keep a slight tension in your abdominal muscles to brace your spine. Your tailbone should point down to the ground, not behind your body, resulting in an over-extended swayback posture.  INCORRECT STANDING POSTURES  Common incorrect standing postures include a forward head, locked knees and/or an excessive swayback. WALKING Walk with an upright posture. Your ears, shoulders and hips should all line-up. PROLONGED ACTIVITY IN A FLEXED POSITION When completing a task that requires you to bend forward at your waist or lean over a low surface, try to find a way to stabilize 3 of 4 of your limbs. You can place a hand or elbow on your thigh or rest a knee on the surface you are reaching across. This will provide you more stability so that your muscles do not fatigue as quickly. By keeping your knees relaxed, or slightly bent, you will also reduce stress across your low back. CORRECT LIFTING TECHNIQUES DO :   Assume a wide stance. This will provide you more stability and the opportunity to get as close as possible to the object which you are lifting.  Tense your abdominals to brace your spine; then bend at the knees and hips. Keeping your back locked in a neutral-spine position, lift using your leg muscles. Lift with your legs, keeping your back straight.  Test the weight of unknown objects before attempting to lift them.  Try to keep your elbows locked down at your sides in order get the best strength from your shoulders when  carrying an object.  Always ask for help when lifting heavy or awkward objects. INCORRECT LIFTING TECHNIQUES DO NOT:   Lock your knees when lifting, even if it is a small object.  Bend and twist. Pivot at your feet or move your feet when needing to change directions.  Assume that you cannot safely pick up a paperclip without proper posture.   This information is not intended to replace advice given to you by your health care provider. Make sure you discuss any questions you have with your health care provider.   Document Released: 01/15/2005 Document Revised: 06/01/2014 Document Reviewed: 04/29/2008 [employer] Interactive Patient Education [employer].

## 2015-12-13 ENCOUNTER — Encounter: Payer: Self-pay | Admitting: Family Medicine

## 2015-12-13 ENCOUNTER — Ambulatory Visit (INDEPENDENT_AMBULATORY_CARE_PROVIDER_SITE_OTHER): Payer: Self-pay | Admitting: Family Medicine

## 2015-12-13 VITALS — BP 102/69 | HR 66 | Temp 98.0°F | Resp 18 | Ht 63.0 in | Wt 104.0 lb

## 2015-12-13 DIAGNOSIS — S161XXS Strain of muscle, fascia and tendon at neck level, sequela: Secondary | ICD-10-CM

## 2015-12-13 DIAGNOSIS — F418 Other specified anxiety disorders: Secondary | ICD-10-CM

## 2015-12-13 DIAGNOSIS — M545 Low back pain, unspecified: Secondary | ICD-10-CM

## 2015-12-13 DIAGNOSIS — M79605 Pain in left leg: Secondary | ICD-10-CM

## 2015-12-13 MED ORDER — BUPROPION HCL 75 MG PO TABS: 75.0000 mg | ORAL_TABLET | Freq: Every day | ORAL | 1 refills | Status: DC
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
## 2015-12-13 NOTE — Patient Instructions (Signed)
I am glad you are feeling better.  Start light stretches for both your neck and the sciatica.  Use flexeril as needed only.  I have called in Wellbutrin refills for you to bridge you until your referral to psychiatry is scheduled.
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
## 2019-07-11 ENCOUNTER — Other Ambulatory Visit: Payer: Self-pay

## 2019-07-11 ENCOUNTER — Emergency Department (HOSPITAL_COMMUNITY)
Admission: EM | Admit: 2019-07-11 | Discharge: 2019-07-11 | Disposition: A | Discharge status: Home / Self Care | Payer: Medicaid Other | Attending: Emergency Medicine | Admitting: Emergency Medicine

## 2019-07-11 ENCOUNTER — Encounter (HOSPITAL_COMMUNITY): Payer: Self-pay

## 2019-07-11 DIAGNOSIS — Z638 Other specified problems related to primary support group: Secondary | ICD-10-CM | POA: Insufficient documentation

## 2019-07-11 DIAGNOSIS — Z639 Problem related to primary support group, unspecified: Secondary | ICD-10-CM
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
## 2019-07-14 ENCOUNTER — Ambulatory Visit: Payer: Managed Care, Other (non HMO) | Admitting: Family Medicine

## 2019-07-22 ENCOUNTER — Encounter: Payer: Self-pay | Admitting: Family Medicine

## 2019-07-22 ENCOUNTER — Ambulatory Visit (INDEPENDENT_AMBULATORY_CARE_PROVIDER_SITE_OTHER): Payer: Self-pay | Admitting: Family Medicine

## 2019-07-22 ENCOUNTER — Other Ambulatory Visit: Payer: Self-pay

## 2019-07-22 VITALS — BP 109/67 | HR 79 | Temp 98.3°F | Resp 16 | Ht 61.5 in | Wt 91.5 lb

## 2019-07-22 DIAGNOSIS — E700 Classical phenylketonuria: Secondary | ICD-10-CM

## 2019-07-22 DIAGNOSIS — F419 Anxiety disorder, unspecified: Secondary | ICD-10-CM

## 2019-07-22 DIAGNOSIS — F32A Depression, unspecified: Secondary | ICD-10-CM

## 2019-07-22 DIAGNOSIS — F418 Other specified anxiety disorders: Secondary | ICD-10-CM

## 2019-07-22 DIAGNOSIS — K582 Mixed irritable bowel syndrome: Secondary | ICD-10-CM

## 2019-07-22 DIAGNOSIS — R202 Paresthesia of skin: Secondary | ICD-10-CM

## 2019-07-22 DIAGNOSIS — F329 Major depressive disorder, single episode, unspecified: Secondary | ICD-10-CM

## 2019-07-22 DIAGNOSIS — E701 Other hyperphenylalaninemias: Secondary | ICD-10-CM

## 2019-07-22 DIAGNOSIS — F3112 Bipolar disorder, current episode manic without psychotic features, moderate: Secondary | ICD-10-CM

## 2019-07-22 DIAGNOSIS — K909 Intestinal malabsorption, unspecified: Secondary | ICD-10-CM

## 2019-07-22 DIAGNOSIS — F39 Unspecified mood [affective] disorder: Secondary | ICD-10-CM

## 2019-07-22 LAB — VITAMIN D 25 HYDROXY (VIT D DEFICIENCY, FRACTURES): VITD: 53.15 ng/mL (ref 30.00–100.00)

## 2019-07-22 LAB — TSH: TSH: 1.34 u[IU]/mL (ref 0.35–4.50)

## 2019-07-22 LAB — T4, FREE: Free T4: 1.01 ng/dL (ref 0.60–1.60)

## 2019-07-22 LAB — B12 AND FOLATE PANEL
Folate: >24.8 ng/mL (ref >5.9)
Vitamin B-12: 1218 pg/mL — ABNORMAL HIGH (ref 211–911)

## 2019-07-22 LAB — T3, FREE: T3, Free: 3.2 pg/mL (ref 2.3–4.2)

## 2019-07-22 MED ORDER — QUETIAPINE FUMARATE 25 MG PO TABS
ORAL_TABLET | ORAL | 2 refills | Status: DC
Start: 2019-07-22 — End: 2019-08-25

## 2019-07-22 NOTE — Patient Instructions (Addendum)
Start seroquel about 1 hour before your bed time. Set a bed time and wake time and stick to it. Goal about 8 hours of sleep a night.   Follow up in 3-4 weeks.   I will call you with lab results. I will place referral to Dr. Edgar Frisk- but typically this requires the patient to call them to set up also.   It is important you set a routine daily schedule and eat healthy.     Major Depressive Disorder, Adult Major depressive disorder (MDD) is a mental health condition. MDD often makes you feel sad, hopeless, or helpless. MDD can also cause symptoms in your body. MDD can affect your:  Work.  School.  Relationships.  Other normal activities. MDD can range from mild to very bad. It may occur once (single episode MDD). It can also occur many times (recurrent MDD). The main symptoms of MDD often include:  Feeling sad, depressed, or irritable most of the time.  Loss of interest. MDD symptoms also include:  Sleeping too much or too little.  Eating too much or too little.  A change in your weight.  Feeling tired (fatigue) or having low energy.  Feeling worthless.  Feeling guilty.  Trouble making decisions.  Trouble thinking clearly.  Thoughts of suicide or harming others.  Feeling weak.  Feeling agitated.  Keeping yourself from being around other people (isolation). Follow these instructions at home: Activity  Do these things as told by your doctor: ? Go back to your normal activities. ? Exercise regularly. ? Spend time outdoors. Alcohol  Talk with your doctor about how alcohol can affect your antidepressant medicines.  Do not drink alcohol. Or, limit how much alcohol you drink. ? This means no more than 1 drink a day for nonpregnant women and 2 drinks a day for men. One drink equals one of these:  12 oz of beer.  5 oz of wine.  1 oz of hard liquor. General instructions  Take over-the-counter and prescription medicines only as told by your doctor.  Eat a  healthy diet.  Get plenty of sleep.  Find activities that you enjoy. Make time to do them.  Think about joining a support group. Your doctor may be able to suggest a group for you.  Keep all follow-up visits as told by your doctor. This is important. Where to find more information:  [employer] on Mental Illness: ? www.nami.[place]: ? https://carter.com/  National Suicide Prevention Lifeline: ? 570-651-4136. This is free, 24-hour help. Contact a doctor if:  Your symptoms get worse.  You have new symptoms. Get help right away if:  You self-harm.  You see, hear, taste, smell, or feel things that are not present (hallucinate). If you ever feel like you may hurt yourself or others, or have thoughts about taking your own life, get help right away. You can go to your nearest emergency department or call:  Your local emergency services (911 in the [place]  A suicide crisis helpline, such as the National Suicide Prevention Lifeline: ? 231-769-1985. This is open 24 hours a day. This information is not intended to replace advice given to you by your health care provider. Make sure you discuss any questions you have with your health care provider. Document Revised: 12/28/2016 Document Reviewed: 10/02/2015 Elsevier Patient Education  2020 [employer].

## 2019-07-22 NOTE — Progress Notes (Signed)
Patient ID: [facility], female  DOB: Dec 21, 1980, 39 y.o.   MRN: [id number] Patient Care Team    Relationship Specialty Notifications Start End  Ma Hillock, DO PCP - General Family Medicine  07/22/19   Obgyn, [facility]    07/22/19     Chief Complaint  Patient presents with  . 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 Genetic MD at [facility] cell phone number [phone] .     Subjective:  Linda [facility] is a 39 y.o.  female present for re-establishment. All past medical history, surgical history, allergies, family history, immunizations, medications and social history were updated in the electronic medical record today. All recent labs, ED visits and hospitalizations within the last year were reviewed.  Anxiety with depression/mood disorder/h/o bipolar d/o: Patient presents today with her mother to discuss her depression and anxiety. Patient has recently followed on some hard times and she has lost her job. She and her daughter are now living with her parents. Patient reports she has been seen at [facility] and in the past and [facility] with Dr. Edgar [facility]. 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 She reports she also has been seeing a therapist at [facility] for about a year. Since May she has been visiting with her on a weekly basis. Patient's mother was asked to step out of the room so that we could talk confidentially. Patient reports symptoms of feeling tingling sensation in her extremities. She has visualized changes or patterns within her TV screen/computer screens. She feels like she has "dents in her thighs. "She feels she has a hypersensitivity to her surroundings. She is had more frequent headaches, nausea and symptoms of both constipation and diarrhea. Patient states she did not want to "sound paranoid", but she has been finding magnets hidden in her headphones and  earrings. She has felt like someone or something is videoing her in her bedroom. She reports one occasion of standing up out of bed and starting to strip her clothes off, and she was unable to control this. Patient has had diagnoses of bipolar disorder, anxiety, depression, PTSD, substance abuse disorder, eating disorder in the past. She has been tried on Wellbutrin, Lamictal and Strattera. She states she has a great difficulty focusing and staying on task. She is prescribed Adderall, Suboxone and Klonopin through other providers. She did start B12 recently. She has lost weight, which is contributed to her PKU condition. Patient reports she is not sleeping well, only a few hours a night at best. She mentions she has taken care of herself and her daughter, on her own for over 11 years on a few occasions. She feels she is going through a rough patch in her life by losing her job, having to move in with her parents. She also mentioned she is recently out of her relationship,? Living with this person in which she is trying to work things out with. She mentioned she also misses the dog that they shared together.  Mood disorder screening completed today: Positive Depression screen [facility] 2/9 07/22/2019 11/29/2015  Decreased Interest 0 0  Down, Depressed, Hopeless 0 0  PHQ - 2 Score 0 0  Altered sleeping 3 -  Tired, decreased energy 1 -  Change in appetite 1 -  Feeling bad or failure about yourself  0 -  Trouble concentrating 0 -  Moving slowly or fidgety/restless  3 -  Suicidal thoughts 0 -  PHQ-9 Score 8 -  Difficult doing work/chores Not difficult at all -   GAD 7 : Generalized Anxiety Score 07/22/2019  Nervous, Anxious, on Edge 0  Control/stop worrying 0  Worry too much - different things 1  Trouble relaxing 0  Restless 0  Easily annoyed or irritable 3  Afraid - awful might happen 0  Total GAD 7 Score 4  Anxiety Difficulty Somewhat difficult       Fall Risk  11/29/2015  Falls in the past year?  No    There is no immunization history on file for this patient.  No exam data present  Past Medical History:  Diagnosis Date  . Anorexia   . Anxiety   . Bipolar disorder ([place])   . Chicken pox   . Depression   . Depression with anxiety   . Frequent headaches   . GERD (gastroesophageal reflux disease)   . Kidney stones   . PKU (phenylketonuria) ([place])   . Pyelonephritis   . Substance abuse ([place])    drug   No Known Allergies Past Surgical History:  Procedure Laterality Date  . NO PAST SURGERIES    . WISDOM TOOTH EXTRACTION     Family History  Problem Relation Age of Onset  . Hearing loss Mother   . Mental illness Father        adopted  . Alcohol abuse Father   . Mental illness Sister   . Alcohol abuse Sister   . ADD / ADHD Daughter   . Mental illness Maternal Grandmother   . Alcohol abuse Maternal Grandfather   . Arthritis Maternal Grandfather   . Diabetes Maternal Grandfather   . Hearing loss Maternal Grandfather    Social History   Social History Narrative   Single, 1 child ([facility]).    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   H/O substance abuse   Drinks caffeine, takes a daily vitamin   Wears her seatbelt, smoke detector in the home   Exercises routinely   Vegetarian (congential- PKU)   Feels safe in her relationships.     Allergies as of 07/22/2019   No Known Allergies     Medication List       Accurate as of July 22, 2019 11:59 PM. If you have any questions, ask your nurse or doctor.        STOP taking these medications   buPROPion 75 MG tablet Commonly known as: WELLBUTRIN Stopped by: Howard Pouch, DO   cyclobenzaprine 5 MG tablet Commonly known as: FLEXERIL Stopped by: Howard Pouch, DO   ibuprofen 800 MG tablet Commonly known as: ADVIL Stopped by: Howard Pouch, DO   Kuvan 100 MG Pack Generic drug: Sapropterin Dihydrochloride Stopped by: Howard Pouch, DO   Kuvan 500 MG Pack Generic drug: Sapropterin  Dihydrochloride Stopped by: Howard Pouch, DO   naproxen 500 MG tablet Commonly known as: Naprosyn Stopped by: Howard Pouch, DO     TAKE these medications   Adderall 20 MG tablet Generic drug: amphetamine-dextroamphetamine Take 20 mg by mouth daily. 1-1.5 tablets daily   Buprenorphine HCl-Naloxone HCl 4-1 MG Film [place] under the tongue. Daily   clonazePAM 0.25 MG disintegrating tablet Commonly known as: KLONOPIN Take 0.25 mg by mouth 2 (two) times daily.   MULTI COMPLETE PO Take by mouth.   Omega-3 1000 MG Caps Take by mouth.   Palynziq 20 MG/ML Sosy Generic drug: Pegvaliase-pqpz Inject into the  skin. Every other day   PROBIOTIC ADVANCED PO Take by mouth.   QUEtiapine 25 MG tablet Commonly known as: SEROquel 1/2 tab 1 hour before bed for 7 days, then increase to 1 tab a night . Started by: Howard Pouch, DO   Vitamin B-12 1000 MCG Subl [place] under the tongue.   Vitamin D (Cholecalciferol) 25 MCG (1000 UT) Caps Take by mouth.       All past medical history, surgical history, allergies, family history, immunizations andmedications were updated in the EMR today and reviewed under the history and medication portions of their EMR.    Recent Results (from the past 2160 hour(s))  TSH     Status: None   Collection Time: 07/22/19 12:11 PM  Result Value Ref Range   TSH 1.34 0.35 - 4.50 uIU/mL  T3, free     Status: None   Collection Time: 07/22/19 12:11 PM  Result Value Ref Range   T3, Free 3.2 2.3 - 4.2 pg/mL  T4, free     Status: None   Collection Time: 07/22/19 12:11 PM  Result Value Ref Range   Free T4 1.01 0.60 - 1.60 ng/dL    Comment: Specimens from patients who are undergoing biotin therapy and /or ingesting biotin supplements may contain high levels of biotin.  The higher biotin concentration in these specimens interferes with this Free T4 assay.  Specimens that contain high levels  of biotin may cause false high results for this Free T4 assay.  Please  interpret results in light of the total clinical presentation of the patient.    B12 and Folate Panel     Status: Abnormal   Collection Time: 07/22/19 12:11 PM  Result Value Ref Range   Vitamin B-12 1,218 (H) 211 - 911 pg/mL   Folate >24.8 >5.9 ng/mL  Vitamin D (25 hydroxy)     Status: None   Collection Time: 07/22/19 12:11 PM  Result Value Ref Range   VITD 53.15 30.00 - 100.00 ng/mL    No results found.   ROS: 14 pt review of systems performed and negative (unless mentioned in an HPI)  Objective: BP 109/67 (BP Location: Left Arm, Patient Position: Sitting, Cuff Size: Normal)   Pulse 79   Temp 98.3 F (36.8 C) (Temporal)   Resp 16   Ht 5' 1.5" (1.562 m)   Wt 91 lb 8 oz (41.5 kg)   LMP 07/15/2019 (Exact Date)   SpO2 100%   BMI 17.01 kg/m  Gen: Afebrile. No acute distress. Nontoxic in appearance, thin, pale anxious appearing female. Very pleasant. HENT: AT. [place]. Eyes:Pupils Equal Round Reactive to light, Extraocular movements intact,  Conjunctiva without redness, discharge or icterus. Neck/lymp/endocrine: Supple, no thyromegaly Skin: no rashes. Warm and well-perfused. Skin intact. Neuro/Msk: Normal gait. PERLA. EOMi. Alert. Oriented x3.  Psych: Normal dress.anxious appearing,  + Pressured speech. + tangential speech, + psychomotor agitation, +paranoid thoughts, +delusions.  No SI or HI.   Assessment/plan: [facility] is a 39 y.o. female present for re-est for  Depression with anxiety/Bipolar affective disorder, currently manic, moderate (HCC)/Mood disorder ([place]) Patient presents today to discuss her mental health, last seen by this provider over 3 years ago. [facility] appears pale and thin compared to when she was last seen. She is demonstrating manic signs during exam today. She has a history of bipolar disorder, PTSD, eating disorder and substance abuse disorder in the past. I have concerns over possible schizoaffective mood disorder given her delusions and hallucinations  she  described today. She can continue Klonopin provided by another provider. Start Seroquel taper nightly. She was instructed to set a set bedtime and wake time-: 8 hours of restful sleep at night if possible. - TSH - T3, free - T4, free - Ambulatory referral to Psychiatry- She desires to return to [facility]. [facility] at [facility]> encouraged her to call them to schedule appointment ASAP. I did Martin a referral today to psychiatry if needed. Close follow-up in 3-4 weeks, unless she is able to get into psychiatry prior to then in which they can take over management. Patient was provided with 24-hour crisis line.  Intestinal malabsorption/IBS/PKU/Paresthesia Rule out thyroid or vitamin deficiencies. Continue B12, consider B complex. Continue vitamin D Continue probiotic - TSH - T3, free - T4, free - B12 and Folate Panel - Vitamin B1 - Vitamin D (25 hydroxy)  Return in about 4 weeks (around 08/19/2019).  Orders Placed This Encounter  Procedures  . TSH  . T3, free  . T4, free  . B12 and Folate Panel  . Vitamin B1  . Vitamin D (25 hydroxy)  . Ambulatory referral to Psychiatry   Meds ordered this encounter  Medications  . QUEtiapine (SEROQUEL) 25 MG tablet    Sig: 1/2 tab 1 hour before bed for 7 days, then increase to 1 tab a night .    Dispense:  30 tablet    Refill:  2    Referral Orders     Ambulatory referral to Psychiatry   Note is dictated utilizing voice recognition software. Although note has been proof read prior to signing, occasional typographical errors still can be missed. If any questions arise, please do not hesitate to call for verification.  Electronically signed by: Howard Pouch, DO [place]

## 2019-07-23 ENCOUNTER — Encounter: Payer: Self-pay | Admitting: Family Medicine

## 2019-08-01 LAB — VITAMIN B1, WHOLE BLOOD: Vitamin B1 (Thiamine), Blood: 153 nmol/L (ref 78–185)

## 2019-08-19 ENCOUNTER — Ambulatory Visit: Payer: Medicaid Other | Admitting: Family Medicine

## 2019-08-25 ENCOUNTER — Ambulatory Visit (INDEPENDENT_AMBULATORY_CARE_PROVIDER_SITE_OTHER): Payer: Self-pay | Admitting: Family Medicine

## 2019-08-25 ENCOUNTER — Encounter: Payer: Self-pay | Admitting: Family Medicine

## 2019-08-25 ENCOUNTER — Other Ambulatory Visit: Payer: Self-pay

## 2019-08-25 VITALS — BP 97/63 | HR 72 | Temp 98.5°F | Resp 17 | Ht 62.0 in | Wt 100.0 lb

## 2019-08-25 DIAGNOSIS — F39 Unspecified mood [affective] disorder: Secondary | ICD-10-CM

## 2019-08-25 DIAGNOSIS — F3112 Bipolar disorder, current episode manic without psychotic features, moderate: Secondary | ICD-10-CM

## 2019-08-25 DIAGNOSIS — F418 Other specified anxiety disorders: Secondary | ICD-10-CM

## 2019-08-25 MED ORDER — QUETIAPINE FUMARATE 25 MG PO TABS: 25.0000 mg | ORAL_TABLET | Freq: Every day | ORAL | 2 refills | Status: AC

## 2019-08-25 MED ORDER — NORETHINDRONE ACET-ETHINYL EST 1-20 MG-MCG PO TABS: 1.0000 | ORAL_TABLET | Freq: Every day | ORAL | 2 refills | Status: AC

## 2019-08-25 NOTE — Patient Instructions (Addendum)
Take Seroquel earlier in the night (around 9pm). If still drowsy in the morning then take earlier.   I called your BCP for you for 3 months.   Oral Contraception Use Oral contraceptive pills (OCPs) are medicines that you take to prevent pregnancy. OCPs work by:  Preventing the ovaries from releasing eggs.  Thickening mucus in the lower part of the uterus (cervix), which prevents sperm from entering the uterus.  Thinning the lining of the uterus (endometrium), which prevents a fertilized egg from attaching to the endometrium. OCPs are highly effective when taken exactly as prescribed. However, OCPs do not prevent sexually transmitted infections (STIs). Safe sex practices, such as using condoms while on an OCP, can help prevent STIs. Before taking OCPs, you may have a physical exam, blood test, and Pap test. A Pap test involves taking a sample of cells from your cervix to check for cancer. Discuss with your health care provider the possible side effects of the OCP you may be prescribed. When you start an OCP, be aware that it can take 2-3 months for your body to adjust to changes in hormone levels. How to take oral contraceptive pills Follow instructions from your health care provider about how to start taking your first cycle of OCPs. Your health care provider may recommend that you:  Start the pill on day 1 of your menstrual period. If you start at this time, you will not need any backup form of birth control (contraception), such as condoms.  Start the pill on the first Sunday after your menstrual period or on the day you get your prescription. In these cases, you will need to use backup contraception for the first week.  Start the pill at any time of your cycle. ? If you take the pill within 5 days of the start of your period, you will not need a backup form of contraception. ? If you start at any other time of your menstrual cycle, you will need to use another form of contraception for 7  days. If your OCP is the type called a minipill, it will protect you from pregnancy after taking it for 2 days (48 hours), and you can stop using backup contraception after that time. After you have started taking OCPs:  If you forget to take 1 pill, take it as soon as you remember. Take the next pill at the regular time.  If you miss 2 or more pills, call your health care provider. Different pills have different instructions for missed doses. Use backup birth control until your next menstrual period starts.  If you use a 28-day pack that contains inactive pills and you miss 1 of the last 7 pills (pills with no hormones), throw away the rest of the non-hormone pills and start a new pill pack. No matter which day you start the OCP, you will always start a new pack on that same day of the week. Have an extra pack of OCPs and a backup contraceptive method available in case you miss some pills or lose your OCP pack. Follow these instructions at home:  Do not use any products that contain nicotine or tobacco, such as cigarettes and e-cigarettes. If you need help quitting, ask your health care provider.  Always use a condom to protect against STIs. OCPs do not protect against STIs.  Use a calendar to mark the days of your menstrual period.  Read the information and directions that came with your OCP. Talk to your health care provider  if you have questions. Contact a health care provider if:  You develop nausea and vomiting.  You have abnormal vaginal discharge or bleeding.  You develop a rash.  You miss your menstrual period. Depending on the type of OCP you are taking, this may be a sign of pregnancy. Ask your health care provider for more information.  You are losing your hair.  You need treatment for mood swings or depression.  You get dizzy when taking the OCP.  You develop acne after taking the OCP.  You become pregnant or think you may be pregnant.  You have diarrhea,  constipation, and abdominal pain or cramps.  You miss 2 or more pills. Get help right away if:  You develop chest pain.  You develop shortness of breath.  You have an uncontrolled or severe headache.  You develop numbness or slurred speech.  You develop visual or speech problems.  You develop pain, redness, and swelling in your legs.  You develop weakness or numbness in your arms or legs. Summary  Oral contraceptive pills (OCPs) are medicines that you take to prevent pregnancy.  OCPs do not prevent sexually transmitted infections (STIs). Always use a condom to protect against STIs.  When you start an OCP, be aware that it can take 2-3 months for your body to adjust to changes in hormone levels.  Read all the information and directions that come with your OCP. This information is not intended to replace advice given to you by your health care provider. Make sure you discuss any questions you have with your health care provider. Document Revised: 05/09/2018 Document Reviewed: 02/27/2016 Elsevier Patient Education  [place] phenomenon is a condition that affects the blood vessels (arteries) that carry blood to your fingers and toes. The arteries that supply blood to your ears, lips, nipples, or the tip of your nose might also be affected. Raynaud phenomenon causes the arteries to become narrow temporarily (spasm). As a result, the flow of blood to the affected areas is temporarily decreased. This usually occurs in response to cold temperatures or stress. During an attack, the skin in the affected areas turns white, then blue, and finally red. You may also feel tingling or numbness in those areas. Attacks usually last for only a brief period, and then the blood flow to the area returns to normal. In most cases, Raynaud phenomenon does not cause serious health problems. What are the causes? In many cases, the cause of this condition is not  known. The condition may occur on its own (primary Raynaud phenomenon) or may be associated with other diseases or factors (secondary Raynaud phenomenon). Possible causes may include:  Diseases or medical conditions that damage the arteries.  Injuries and repetitive actions that hurt the hands or feet.  Being exposed to certain chemicals.  Taking medicines that narrow the arteries.  Other medical conditions, such as lupus, scleroderma, rheumatoid arthritis, thyroid problems, blood disorders, Sjogren syndrome, or atherosclerosis. What increases the risk? The following factors may make you more likely to develop this condition:  Being 22-35 years old.  Being female.  Having a family history of Raynaud phenomenon.  Living in a cold climate.  Smoking. What are the signs or symptoms? Symptoms of this condition usually occur when you are exposed to cold temperatures or when you have emotional stress. The symptoms may last for a few minutes or up to several hours. They usually affect your fingers but may also affect  your toes, nipples, lips, ears, or the tip of your nose. Symptoms may include:  Changes in skin color. The skin in the affected areas will turn pale or white. The skin may then change from white to bluish to red as normal blood flow returns to the area.  Numbness, tingling, or pain in the affected areas. In severe cases, symptoms may include:  Skin sores.  Tissues decaying and dying (gangrene). How is this diagnosed? This condition may be diagnosed based on:  Your symptoms and medical history.  A physical exam. During the exam, you may be asked to put your hands in cold water to check for a reaction to cold temperature.  Tests, such as: ? Blood tests to check for other diseases or conditions. ? A test to check the movement of blood through your arteries and veins (vascular ultrasound). ? A test in which the skin at the base of your fingernail is examined under a  microscope (nailfold capillaroscopy). How is this treated? Treatment for this condition often involves making lifestyle changes and taking steps to control your exposure to cold temperatures. For more severe cases, medicine (calcium channel blockers) may be used to improve blood flow. Surgery is sometimes done to block the nerves that control the affected arteries, but this is rare. Follow these instructions at home: Avoiding cold temperatures Take these steps to avoid exposure to cold:  If possible, stay indoors during cold weather.  When you go outside during cold weather, dress in layers and wear mittens, a hat, a scarf, and warm footwear.  Wear mittens or gloves when handling ice or frozen food.  Use holders for glasses or cans containing cold drinks.  Let warm water run for a while before taking a shower or bath.  Warm up the car before driving in cold weather. Lifestyle   If possible, avoid stressful and emotional situations. Try to find ways to manage your stress, such as: ? Exercise. ? Yoga. ? Meditation. ? Biofeedback.  Do not use any products that contain nicotine or tobacco, such as cigarettes and e-cigarettes. If you need help quitting, ask your health care provider.  Avoid secondhand smoke.  Limit your use of caffeine. ? Switch to decaffeinated coffee, tea, and soda. ? Avoid chocolate.  Avoid vibrating tools and machinery. General instructions  Protect your hands and feet from injuries, cuts, or bruises.  Avoid wearing tight rings or wristbands.  Wear loose fitting socks and comfortable, roomy shoes.  Take over-the-counter and prescription medicines only as told by your health care provider. Contact a health care provider if:  Your discomfort becomes worse despite lifestyle changes.  You develop sores on your fingers or toes that do not heal.  Your fingers or toes turn black.  You have breaks in the skin on your fingers or toes.  You have a  fever.  You have pain or swelling in your joints.  You have a rash.  Your symptoms occur on only one side of your body. Summary  Raynaud phenomenon is a condition that affects the arteries that carry blood to your fingers, toes, ears, lips, nipples, or the tip of your nose.  In many cases, the cause of this condition is not known.  Symptoms of this condition include changes in skin color, and numbness and tingling of the affected area.  Treatment for this condition includes lifestyle changes, reducing exposure to cold temperatures, and using medicines for severe cases of the condition.  Contact your health care provider if your  condition worsens despite treatment. This information is not intended to replace advice given to you by your health care provider. Make sure you discuss any questions you have with your health care provider. Document Revised: 01/18/2017 Document Reviewed: 02/27/2016 Elsevier Patient Education  2020 [employer].

## 2019-08-25 NOTE — Progress Notes (Signed)
Patient ID: Linda Martin, female  DOB: 08-18-1980, 39 y.o.   MRN: [id number] Patient Care Team    Relationship Specialty Notifications Start End  Ma Hillock, DO PCP - General Family Medicine  07/22/19   Obgyn, Erling Conte    07/22/19     Chief Complaint  Patient presents with  . Depression    Pt states she is doing a lot better with medications   . Anxiety    Subjective:  Linda Martin is a 39 y.o.  female present for  Anxiety with depression/mood disorder/h/o bipolar d/o:  She returns today to discuss her anxiety and depression.  She reports since starting the Seroquel she had noticed a significant change.  He has tapered up to Seroquel 25 mg a day and it has slowed down her thoughts and her anxiety.  She is able to sleep at night.  She overall is feeling greatly improved.  She reports she is excited to see her therapist tomorrow because she is finally able to slow down her thoughts and communicate more effectively. Prior note:  Patient presents today with her mother to discuss her depression and anxiety. Patient has recently followed on some hard times and she has lost her job. She and her daughter are now living with her parents. Patient reports she has been seen at [facility] psychiatry and in the past and [facility] with Dr. Edgar Frisk. 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 She reports she also has [facility]. Since May she has been visiting with her on a weekly basis. Patient's mother was asked to step out of the room so that we could talk confidentially. Patient reports symptoms of feeling tingling sensation in her extremities. She has visualized changes or patterns within her TV screen/computer screens. She feels like she has "dents in her thighs. "She feels she has a hypersensitivity to her surroundings. She is had more frequent headaches, nausea and symptoms of both constipation and diarrhea. Patient states  she did not want to "sound paranoid", but she has been finding magnets hidden in her headphones and earrings. She has felt like someone or something is videoing her in her bedroom. She reports one occasion of standing up out of bed and starting to strip her clothes off, and she was unable to control this. Patient has had diagnoses of bipolar disorder, anxiety, depression, PTSD, substance abuse disorder, eating disorder in the past. She has been tried on Wellbutrin, Lamictal and Strattera. She states she has a great difficulty focusing and staying on task. She is prescribed Adderall, Suboxone and Klonopin through other providers. She did start B12 recently. She has lost weight, which is contributed to her PKU condition. Patient reports she is not sleeping well, only a few hours a night at best. She mentions she has taken care of herself and her daughter, on her own for over 11 years on a few occasions. She feels she is going through a rough patch in her life by losing her job, having to move in with her parents. She also mentioned she is recently out of her relationship,? Living with this person in which she is trying to work things out with. She mentioned she also misses the dog that they shared together.  Mood disorder screening completed today: Positive Depression screen [facility] 2/9 08/25/2019 07/22/2019 11/29/2015  Decreased Interest 0 0 0  Down, Depressed, Hopeless 0 0 0  PHQ - 2  Score 0 0 0  Altered sleeping 0 3 -  Tired, decreased energy 1 1 -  Change in appetite 0 1 -  Feeling bad or failure about yourself  0 0 -  Trouble concentrating 3 0 -  Moving slowly or fidgety/restless 0 3 -  Suicidal thoughts 0 0 -  PHQ-9 Score 4 8 -  Difficult doing work/chores Very difficult Not difficult at all -   GAD 7 : Generalized Anxiety Score 08/25/2019 07/22/2019  Nervous, Anxious, on Edge 1 0  Control/stop worrying 0 0  Worry too much - different things 1 1  Trouble relaxing 0 0  Restless 0 0  Easily annoyed  or irritable 0 3  Afraid - awful might happen 0 0  Total GAD 7 Score 2 4  Anxiety Difficulty Not difficult at all Somewhat difficult       Fall Risk  11/29/2015  Falls in the past year? No    There is no immunization history on file for this patient.  No exam data present  Past Medical History:  Diagnosis Date  . Anorexia   . Anxiety   . Bipolar disorder ([place])   . Chicken pox   . Depression   . Depression with anxiety   . Frequent headaches   . GERD (gastroesophageal reflux disease)   . Kidney stones   . PKU (phenylketonuria) ([place])   . Pyelonephritis   . Substance abuse ([place])    drug   No Known Allergies Past Surgical History:  Procedure Laterality Date  . NO PAST SURGERIES    . WISDOM TOOTH EXTRACTION     Family History  Problem Relation Age of Onset  . Hearing loss Mother   . Mental illness Father        adopted  . Alcohol abuse Father   . Mental illness Sister   . Alcohol abuse Sister   . ADD / ADHD Daughter   . Mental illness Maternal Grandmother   . Alcohol abuse Maternal Grandfather   . Arthritis Maternal Grandfather   . Diabetes Maternal Grandfather   . Hearing loss Maternal Grandfather    Social History   Social History Narrative   Single, 1 child ([name]).    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   H/O substance abuse   Drinks caffeine, takes a daily vitamin   Wears her seatbelt, smoke detector in the home   Exercises routinely   Vegetarian (congential- PKU)   Feels safe in her relationships.     Allergies as of 08/25/2019   No Known Allergies     Medication List       Accurate as of August 25, 2019 11:59 PM. If you have any questions, ask your nurse or doctor.        Adderall 20 MG tablet Generic drug: amphetamine-dextroamphetamine Take 20 mg by mouth daily. 1-1.5 tablets daily   Buprenorphine HCl-Naloxone HCl 4-1 MG Film Place under the tongue. Daily   clonazePAM 0.25 MG disintegrating  tablet Commonly known as: KLONOPIN Take 0.25 mg by mouth 2 (two) times daily.   MULTI COMPLETE PO Take by mouth.   norethindrone-ethinyl estradiol 1-20 MG-MCG tablet Commonly known as: Loestrin 1/20 (21) Take 1 tablet by mouth daily. Started by: Howard Pouch, DO   Omega-3 1000 MG Caps Take by mouth.   Palynziq 20 MG/ML Sosy Generic drug: Pegvaliase-pqpz Inject into the skin. Every other day   PROBIOTIC ADVANCED PO Take by mouth.   QUEtiapine  25 MG tablet Commonly known as: SEROquel Take 1 tablet (25 mg total) by mouth at bedtime. What changed:   how much to take  how to take this  when to take this  additional instructions Changed by: Howard Pouch, DO   Vitamin B-12 1000 MCG Subl Place under the tongue.   Vitamin D (Cholecalciferol) 25 MCG (1000 UT) Caps Take by mouth.       All past medical history, surgical history, allergies, family history, immunizations andmedications were updated in the EMR today and reviewed under the history and medication portions of their EMR.     No results found.   ROS: 14 pt review of systems performed and negative (unless mentioned in an HPI)  Objective: BP (!) 97/63 (BP Location: Right Arm, Patient Position: Sitting, Cuff Size: Normal)   Pulse 72   Temp 98.5 F (36.9 C) (Temporal)   Resp 17   Ht 5' 2" (1.575 m)   Wt 100 lb (45.4 kg)   LMP 08/23/2019 (Exact Date)   SpO2 99%   BMI 18.29 kg/m  Gen: Afebrile. No acute distress.  HENT: AT. [place].  Eyes:Pupils Equal Round Reactive to light, Extraocular movements intact,  Conjunctiva without redness, discharge or icterus.  Neuro:  Normal gait. PERLA. EOMi. Alert. Oriented x3. Psych: Normal affect, dress and demeanor.  Mildly pressured speech-greatly improved. Normal thought content and judgment.  Overall appears more relaxed.  Assessment/plan: SIANI UTKE is a 39 y.o. female present for re-est for  Depression with anxiety/Bipolar affective disorder, currently manic,  moderate (HCC)/Mood disorder ([place])  She has a history of bipolar disorder, PTSD, eating disorder and substance abuse disorder in the past. I have concerns over possible schizoaffective mood disorder given her delusions and hallucinations she described at her last visit.. She can continue Klonopin provided by another provider. Tinea Seroquel 25 mg daily She was instructed to set a set bedtime and wake time-: 8 hours of restful sleep at night if possible. - Ambulatory referral to Psychiatry placed last visit- She desires to return to [facility]> encouraged her to call them to schedule appointment ASAP.  -Continue routine follow-ups with her therapist. Follow-up 3 months, sooner if needed.   Intestinal malabsorption/IBS/PKU/Paresthesia. She has been able to gain 9 pounds since being seen last month. Continue B12, or B complex. Continue vitamin D Continue probiotic - TSH> normal - B12 and Folate Panel> normal - Vitamin B1> normal - Vitamin D (25 hydroxy)> normal  Birth control pills: Agreed to refill patient's birth control pills.  Any further refills will come from her gynecologist which she making appointment with for next month.  Return in about 3 months (around 11/25/2019) for [facility] (30 min).  No orders of the defined types were placed in this encounter.  Meds ordered this encounter  Medications  . norethindrone-ethinyl estradiol (LOESTRIN 1/20, 21,) 1-20 MG-MCG tablet    Sig: Take 1 tablet by mouth daily.    Dispense:  28 tablet    Refill:  2  . QUEtiapine (SEROQUEL) 25 MG tablet    Sig: Take 1 tablet (25 mg total) by mouth at bedtime.    Dispense:  30 tablet    Refill:  2   Referral Orders  No referral(s) requested today     Note is dictated utilizing voice recognition software. Although note has been proof read prior to signing, occasional typographical errors still can be missed. If any questions arise, please do not hesitate to call for verification.  Electronically signed by: Howard Pouch, DO [place]

## 2019-11-25 ENCOUNTER — Ambulatory Visit: Payer: Medicaid Other | Admitting: Family Medicine

## 2019-11-30 ENCOUNTER — Other Ambulatory Visit: Payer: Self-pay

## 2020-03-03 ENCOUNTER — Telehealth (INDEPENDENT_AMBULATORY_CARE_PROVIDER_SITE_OTHER): Payer: Medicaid Other | Admitting: Family Medicine

## 2020-03-03 DIAGNOSIS — U071 COVID-19: Secondary | ICD-10-CM

## 2020-03-03 MED ORDER — BENZONATATE 100 MG PO CAPS
100.0000 mg | ORAL_CAPSULE | Freq: Three times a day (TID) | ORAL | 0 refills | Status: AC | PRN
Start: 2020-03-03 — End: ?

## 2020-03-03 MED ORDER — ONDANSETRON HCL 4 MG PO TABS: 4.0000 mg | ORAL_TABLET | Freq: Three times a day (TID) | ORAL | 0 refills | Status: AC | PRN

## 2020-03-03 NOTE — Progress Notes (Signed)
Virtual Visit via Video Note  I connected with Linda Martin  on 03/03/20 at  3:40 PM EST by a video enabled telemedicine application and verified that I am speaking with the correct person using two identifiers.  Location patient: home, [place] Location provider:work or home office Persons participating in the virtual visit: patient, provider  I discussed the limitations of evaluation and management by telemedicine and the availability of in person appointments. The patient expressed understanding and agreed to proceed.   HPI:  Acute telemedicine visit for COVID19: -Onset: 2 days ago, COVID19 test yesterday was positive -Symptoms include: sore throat, nasal congestion, laryngitis, upset stomach, nausea -Denies: SOB, fever, CP, vomiting, diarrhea, inability to eat/drink/get out of bed -daughter has had many covid exposures at school -Has tried: OTC cold medication, Advil, vit C, Vit D -Pertinent past medical history: see below -Pertinent medication allergies: nkda -COVID-19 vaccine status: fully vaccinated, 2 doses -denies any chance of pregnancy  ROS: See pertinent positives and negatives per HPI.  Past Medical History:  Diagnosis Date  . Anorexia   . Anxiety   . Bipolar disorder ([place])   . Chicken pox   . Depression   . Depression with anxiety   . Frequent headaches   . GERD (gastroesophageal reflux disease)   . Kidney stones   . PKU (phenylketonuria) ([place])   . Pyelonephritis   . Substance abuse ([place])    drug    Past Surgical History:  Procedure Laterality Date  . NO PAST SURGERIES    . WISDOM TOOTH EXTRACTION       Current Outpatient Medications:  .  benzonatate (TESSALON PERLES) 100 MG capsule, Take 1 capsule (100 mg total) by mouth 3 (three) times daily as needed., Disp: 20 capsule, Rfl: 0 .  ondansetron (ZOFRAN) 4 MG tablet, Take 1 tablet (4 mg total) by mouth every 8 (eight) hours as needed for nausea or vomiting., Disp: 20 tablet, Rfl: 0 .   amphetamine-dextroamphetamine (ADDERALL) 20 MG tablet, Take 20 mg by mouth daily. 1-1.5 tablets daily, Disp: , Rfl:  .  Buprenorphine HCl-Naloxone HCl 4-1 MG FILM, Place under the tongue. Daily, Disp: , Rfl:  .  clonazePAM (KLONOPIN) 0.25 MG disintegrating tablet, Take 0.25 mg by mouth 2 (two) times daily., Disp: , Rfl:  .  Cyanocobalamin (VITAMIN B-12) 1000 MCG SUBL, Place under the tongue., Disp: , Rfl:  .  Multiple Vitamins-Minerals (MULTI COMPLETE PO), Take by mouth., Disp: , Rfl:  .  norethindrone-ethinyl estradiol (LOESTRIN 1/20, 21,) 1-20 MG-MCG tablet, Take 1 tablet by mouth daily., Disp: 28 tablet, Rfl: 2 .  Omega-3 1000 MG CAPS, Take by mouth., Disp: , Rfl:  .  Pegvaliase-pqpz (PALYNZIQ) 20 MG/ML SOSY, Inject into the skin. Every other day, Disp: , Rfl:  .  Probiotic Product (PROBIOTIC ADVANCED PO), Take by mouth., Disp: , Rfl:  .  QUEtiapine (SEROQUEL) 25 MG tablet, Take 1 tablet (25 mg total) by mouth at bedtime., Disp: 30 tablet, Rfl: 2 .  Vitamin D, Cholecalciferol, 25 MCG (1000 UT) CAPS, Take by mouth., Disp: , Rfl:   EXAM:  VITALS per patient if applicable:  GENERAL: alert, oriented, appears well and in no acute distress  HEENT: atraumatic, conjunttiva clear, no obvious abnormalities on inspection of external nose and ears  NECK: normal movements of the head and neck  LUNGS: on inspection no signs of respiratory distress, breathing rate appears normal, no obvious gross SOB, gasping or wheezing  CV: no obvious cyanosis  MS: moves all visible extremities without  noticeable abnormality  PSYCH/NEURO: pleasant and cooperative, no obvious depression or anxiety, speech and thought processing grossly intact  ASSESSMENT AND PLAN:  Discussed the following assessment and plan:  COVID-19  -we discussed possible serious and likely etiologies, options for evaluation and workup, limitations of telemedicine visit vs in person visit, treatment, treatment risks and precautions. Pt  prefers to treat via telemedicine empirically rather than in person at this moment.  Discussed treatment options, ideal treatment window, potential complications, isolation and precautions for COVID-19.  She opted against referral for Covid outpatient treatment at this time.  She did want a prescription for cough, Tessalon Rx sent and an antiemetic. Reports she has done well with zofran in the past. Rx sent.  Other symptomatic care measures summarized in patient instructions.  Also advised that she touch base with her specialist regarding PKU and acute Covid illness, to see if anything further is needed.  She reports she already has contacted them. Work/School slipped offered: provided in patient instructions   Scheduled follow up with PCP offered: Agrees to schedule follow-up through her PCP office if needed. Advised to seek prompt in person care if worsening, new symptoms arise, or if is not improving with treatment. Discussed options for inperson care if PCP office not available. Did let this patient know that I only do telemedicine on Tuesdays and Thursdays for [place]. Advised to schedule follow up visit with PCP or [facility] if any further questions or concerns to avoid delays in care.   I discussed the assessment and treatment plan with the patient. The patient was provided an opportunity to ask questions and all were answered. The patient agreed with the plan and demonstrated an understanding of the instructions.     Lucretia Kern, DO

## 2020-03-03 NOTE — Patient Instructions (Addendum)
---------------------------------------------------------------------------------------------------------------------------  WORK SLIP:  Patient Linda Martin,  1980/08/22, was seen for a medical visit today, 03/03/20 . Please excuse from work for a COVID like illness. We advise 10 days minimum from the onset of symptoms (03/29/2020) PLUS 1 day of no fever and improved symptoms.   Sincerely: E-signature: Dr. Colin Benton, DO [facility] [place] Ph: [phone]   ------------------------------------------------------------------------------------------------------------------------------   HOME CARE TIPS:   -I sent the medication(s) we discussed to your pharmacy: Meds ordered this encounter  Medications  . benzonatate (TESSALON PERLES) 100 MG capsule    Sig: Take 1 capsule (100 mg total) by mouth 3 (three) times daily as needed.    Dispense:  20 capsule    Refill:  0  . ondansetron (ZOFRAN) 4 MG tablet    Sig: Take 1 tablet (4 mg total) by mouth every 8 (eight) hours as needed for nausea or vomiting.    Dispense:  20 tablet    Refill:  0    -can use nasal saline a few times per day if you have nasal congestion  -stay hydrated, drink plenty of fluids and eat small healthy meals - avoid dairy  -can take 1000 IU (37mg) Vit D3 and 100-500 mg of Vit C daily per instructions  -If the Covid test is positive, check out the CDC website for more information on home care, transmission and treatment for COVID19  -follow up with your doctor in 2-3 days unless improving and feeling better  -stay home while sick, except to seek medical care, and if you have COVID19 ideally it would be best to stay home for a full 10 days since the onset of symptoms PLUS one day of no fever and feeling better. Wear a good mask (such as N95 or KN95) if around others to reduce the risk of transmission.  It was nice to meet you today, and I really hope you are feeling better soon. I  help [place] out with telemedicine visits on Tuesdays and Thursdays and am available for visits on those days. If you have any concerns or questions following this visit please schedule a follow up visit with your Primary Care doctor or seek care at a local urgent care clinic to avoid delays in care.    Seek in person care or schedule a follow up video visit promptly if your symptoms worsen, new concerns arise or you are not improving with treatment. Call 911 and/or seek emergency care if your symptoms are severe or life threatening.

## 2021-12-29 DIAGNOSIS — Z419 Encounter for procedure for purposes other than remedying health state, unspecified: Secondary | ICD-10-CM | POA: Diagnosis not present

## 2022-01-03 DIAGNOSIS — F411 Generalized anxiety disorder: Secondary | ICD-10-CM | POA: Diagnosis not present

## 2022-01-29 DIAGNOSIS — Z419 Encounter for procedure for purposes other than remedying health state, unspecified: Secondary | ICD-10-CM | POA: Diagnosis not present

## 2022-02-14 DIAGNOSIS — F411 Generalized anxiety disorder: Secondary | ICD-10-CM | POA: Diagnosis not present

## 2022-03-01 DIAGNOSIS — Z419 Encounter for procedure for purposes other than remedying health state, unspecified: Secondary | ICD-10-CM | POA: Diagnosis not present

## 2022-03-20 DIAGNOSIS — F4310 Post-traumatic stress disorder, unspecified: Secondary | ICD-10-CM | POA: Diagnosis not present

## 2022-03-20 DIAGNOSIS — F411 Generalized anxiety disorder: Secondary | ICD-10-CM | POA: Diagnosis not present

## 2022-03-20 DIAGNOSIS — F320 Major depressive disorder, single episode, mild: Secondary | ICD-10-CM | POA: Diagnosis not present

## 2022-03-20 DIAGNOSIS — F909 Attention-deficit hyperactivity disorder, unspecified type: Secondary | ICD-10-CM | POA: Diagnosis not present

## 2022-03-30 DIAGNOSIS — Z419 Encounter for procedure for purposes other than remedying health state, unspecified: Secondary | ICD-10-CM | POA: Diagnosis not present

## 2022-04-16 DIAGNOSIS — F4310 Post-traumatic stress disorder, unspecified: Secondary | ICD-10-CM | POA: Diagnosis not present

## 2022-04-16 DIAGNOSIS — F320 Major depressive disorder, single episode, mild: Secondary | ICD-10-CM | POA: Diagnosis not present

## 2022-04-16 DIAGNOSIS — Z7151 Drug abuse counseling and surveillance of drug abuser: Secondary | ICD-10-CM | POA: Diagnosis not present

## 2022-04-16 DIAGNOSIS — F909 Attention-deficit hyperactivity disorder, unspecified type: Secondary | ICD-10-CM | POA: Diagnosis not present

## 2022-04-16 DIAGNOSIS — F411 Generalized anxiety disorder: Secondary | ICD-10-CM | POA: Diagnosis not present

## 2022-04-30 DIAGNOSIS — Z419 Encounter for procedure for purposes other than remedying health state, unspecified: Secondary | ICD-10-CM | POA: Diagnosis not present

## 2022-05-02 DIAGNOSIS — F411 Generalized anxiety disorder: Secondary | ICD-10-CM | POA: Diagnosis not present

## 2022-05-02 DIAGNOSIS — F909 Attention-deficit hyperactivity disorder, unspecified type: Secondary | ICD-10-CM | POA: Diagnosis not present

## 2022-05-02 DIAGNOSIS — F4310 Post-traumatic stress disorder, unspecified: Secondary | ICD-10-CM | POA: Diagnosis not present

## 2022-05-02 DIAGNOSIS — F320 Major depressive disorder, single episode, mild: Secondary | ICD-10-CM | POA: Diagnosis not present

## 2022-05-18 DIAGNOSIS — F411 Generalized anxiety disorder: Secondary | ICD-10-CM | POA: Diagnosis not present

## 2022-05-18 DIAGNOSIS — F909 Attention-deficit hyperactivity disorder, unspecified type: Secondary | ICD-10-CM | POA: Diagnosis not present

## 2022-05-18 DIAGNOSIS — F4310 Post-traumatic stress disorder, unspecified: Secondary | ICD-10-CM | POA: Diagnosis not present

## 2022-05-18 DIAGNOSIS — F320 Major depressive disorder, single episode, mild: Secondary | ICD-10-CM | POA: Diagnosis not present

## 2022-05-18 DIAGNOSIS — Z7151 Drug abuse counseling and surveillance of drug abuser: Secondary | ICD-10-CM | POA: Diagnosis not present

## 2022-05-30 DIAGNOSIS — F320 Major depressive disorder, single episode, mild: Secondary | ICD-10-CM | POA: Diagnosis not present

## 2022-05-30 DIAGNOSIS — Z419 Encounter for procedure for purposes other than remedying health state, unspecified: Secondary | ICD-10-CM | POA: Diagnosis not present

## 2022-05-30 DIAGNOSIS — F909 Attention-deficit hyperactivity disorder, unspecified type: Secondary | ICD-10-CM | POA: Diagnosis not present

## 2022-05-30 DIAGNOSIS — F411 Generalized anxiety disorder: Secondary | ICD-10-CM | POA: Diagnosis not present

## 2022-05-30 DIAGNOSIS — F4310 Post-traumatic stress disorder, unspecified: Secondary | ICD-10-CM | POA: Diagnosis not present

## 2022-05-31 DIAGNOSIS — F411 Generalized anxiety disorder: Secondary | ICD-10-CM | POA: Diagnosis not present

## 2022-05-31 DIAGNOSIS — Z7151 Drug abuse counseling and surveillance of drug abuser: Secondary | ICD-10-CM | POA: Diagnosis not present

## 2022-05-31 DIAGNOSIS — F4310 Post-traumatic stress disorder, unspecified: Secondary | ICD-10-CM | POA: Diagnosis not present

## 2022-05-31 DIAGNOSIS — F320 Major depressive disorder, single episode, mild: Secondary | ICD-10-CM | POA: Diagnosis not present

## 2022-05-31 DIAGNOSIS — F909 Attention-deficit hyperactivity disorder, unspecified type: Secondary | ICD-10-CM | POA: Diagnosis not present

## 2022-06-27 DIAGNOSIS — F4310 Post-traumatic stress disorder, unspecified: Secondary | ICD-10-CM | POA: Diagnosis not present

## 2022-06-27 DIAGNOSIS — F411 Generalized anxiety disorder: Secondary | ICD-10-CM | POA: Diagnosis not present

## 2022-06-27 DIAGNOSIS — F909 Attention-deficit hyperactivity disorder, unspecified type: Secondary | ICD-10-CM | POA: Diagnosis not present

## 2022-06-27 DIAGNOSIS — F320 Major depressive disorder, single episode, mild: Secondary | ICD-10-CM | POA: Diagnosis not present

## 2022-06-28 DIAGNOSIS — F320 Major depressive disorder, single episode, mild: Secondary | ICD-10-CM | POA: Diagnosis not present

## 2022-06-28 DIAGNOSIS — Z7151 Drug abuse counseling and surveillance of drug abuser: Secondary | ICD-10-CM | POA: Diagnosis not present

## 2022-06-28 DIAGNOSIS — F909 Attention-deficit hyperactivity disorder, unspecified type: Secondary | ICD-10-CM | POA: Diagnosis not present

## 2022-06-28 DIAGNOSIS — F4310 Post-traumatic stress disorder, unspecified: Secondary | ICD-10-CM | POA: Diagnosis not present

## 2022-06-28 DIAGNOSIS — F411 Generalized anxiety disorder: Secondary | ICD-10-CM | POA: Diagnosis not present

## 2022-06-30 DIAGNOSIS — Z419 Encounter for procedure for purposes other than remedying health state, unspecified: Secondary | ICD-10-CM | POA: Diagnosis not present

## 2022-07-25 DIAGNOSIS — F909 Attention-deficit hyperactivity disorder, unspecified type: Secondary | ICD-10-CM | POA: Diagnosis not present

## 2022-07-25 DIAGNOSIS — F320 Major depressive disorder, single episode, mild: Secondary | ICD-10-CM | POA: Diagnosis not present

## 2022-07-25 DIAGNOSIS — F411 Generalized anxiety disorder: Secondary | ICD-10-CM | POA: Diagnosis not present

## 2022-07-25 DIAGNOSIS — F4310 Post-traumatic stress disorder, unspecified: Secondary | ICD-10-CM | POA: Diagnosis not present

## 2022-07-26 DIAGNOSIS — F4310 Post-traumatic stress disorder, unspecified: Secondary | ICD-10-CM | POA: Diagnosis not present

## 2022-07-26 DIAGNOSIS — F909 Attention-deficit hyperactivity disorder, unspecified type: Secondary | ICD-10-CM | POA: Diagnosis not present

## 2022-07-26 DIAGNOSIS — F411 Generalized anxiety disorder: Secondary | ICD-10-CM | POA: Diagnosis not present

## 2022-07-26 DIAGNOSIS — Z7151 Drug abuse counseling and surveillance of drug abuser: Secondary | ICD-10-CM | POA: Diagnosis not present

## 2022-07-26 DIAGNOSIS — F320 Major depressive disorder, single episode, mild: Secondary | ICD-10-CM | POA: Diagnosis not present

## 2022-07-30 DIAGNOSIS — Z419 Encounter for procedure for purposes other than remedying health state, unspecified: Secondary | ICD-10-CM | POA: Diagnosis not present

## 2022-08-23 DIAGNOSIS — F320 Major depressive disorder, single episode, mild: Secondary | ICD-10-CM | POA: Diagnosis not present

## 2022-08-23 DIAGNOSIS — F411 Generalized anxiety disorder: Secondary | ICD-10-CM | POA: Diagnosis not present

## 2022-08-23 DIAGNOSIS — F909 Attention-deficit hyperactivity disorder, unspecified type: Secondary | ICD-10-CM | POA: Diagnosis not present

## 2022-08-23 DIAGNOSIS — F4310 Post-traumatic stress disorder, unspecified: Secondary | ICD-10-CM | POA: Diagnosis not present

## 2022-08-23 DIAGNOSIS — Z7151 Drug abuse counseling and surveillance of drug abuser: Secondary | ICD-10-CM | POA: Diagnosis not present

## 2022-08-30 DIAGNOSIS — Z419 Encounter for procedure for purposes other than remedying health state, unspecified: Secondary | ICD-10-CM | POA: Diagnosis not present

## 2022-09-30 DIAGNOSIS — Z419 Encounter for procedure for purposes other than remedying health state, unspecified: Secondary | ICD-10-CM | POA: Diagnosis not present

## 2022-10-15 DIAGNOSIS — F909 Attention-deficit hyperactivity disorder, unspecified type: Secondary | ICD-10-CM | POA: Diagnosis not present

## 2022-10-15 DIAGNOSIS — Z7151 Drug abuse counseling and surveillance of drug abuser: Secondary | ICD-10-CM | POA: Diagnosis not present

## 2022-10-15 DIAGNOSIS — F4310 Post-traumatic stress disorder, unspecified: Secondary | ICD-10-CM | POA: Diagnosis not present

## 2022-10-15 DIAGNOSIS — F320 Major depressive disorder, single episode, mild: Secondary | ICD-10-CM | POA: Diagnosis not present

## 2022-10-15 DIAGNOSIS — F411 Generalized anxiety disorder: Secondary | ICD-10-CM | POA: Diagnosis not present

## 2022-10-17 DIAGNOSIS — F411 Generalized anxiety disorder: Secondary | ICD-10-CM | POA: Diagnosis not present

## 2022-10-17 DIAGNOSIS — F320 Major depressive disorder, single episode, mild: Secondary | ICD-10-CM | POA: Diagnosis not present

## 2022-10-17 DIAGNOSIS — F4310 Post-traumatic stress disorder, unspecified: Secondary | ICD-10-CM | POA: Diagnosis not present

## 2022-10-17 DIAGNOSIS — F909 Attention-deficit hyperactivity disorder, unspecified type: Secondary | ICD-10-CM | POA: Diagnosis not present

## 2022-10-30 DIAGNOSIS — Z419 Encounter for procedure for purposes other than remedying health state, unspecified: Secondary | ICD-10-CM | POA: Diagnosis not present

## 2022-11-02 DIAGNOSIS — E701 Other hyperphenylalaninemias: Secondary | ICD-10-CM | POA: Diagnosis not present

## 2022-11-07 DIAGNOSIS — Z7151 Drug abuse counseling and surveillance of drug abuser: Secondary | ICD-10-CM | POA: Diagnosis not present

## 2022-11-07 DIAGNOSIS — F320 Major depressive disorder, single episode, mild: Secondary | ICD-10-CM | POA: Diagnosis not present

## 2022-11-07 DIAGNOSIS — F4310 Post-traumatic stress disorder, unspecified: Secondary | ICD-10-CM | POA: Diagnosis not present

## 2022-11-07 DIAGNOSIS — F909 Attention-deficit hyperactivity disorder, unspecified type: Secondary | ICD-10-CM | POA: Diagnosis not present

## 2022-11-07 DIAGNOSIS — F411 Generalized anxiety disorder: Secondary | ICD-10-CM | POA: Diagnosis not present

## 2022-11-30 DIAGNOSIS — Z419 Encounter for procedure for purposes other than remedying health state, unspecified: Secondary | ICD-10-CM | POA: Diagnosis not present

## 2022-12-12 DIAGNOSIS — F411 Generalized anxiety disorder: Secondary | ICD-10-CM | POA: Diagnosis not present

## 2023-05-11 DIAGNOSIS — Z419 Encounter for procedure for purposes other than remedying health state, unspecified: Secondary | ICD-10-CM | POA: Diagnosis not present

## 2023-06-10 DIAGNOSIS — Z419 Encounter for procedure for purposes other than remedying health state, unspecified: Secondary | ICD-10-CM | POA: Diagnosis not present

## 2023-06-19 DIAGNOSIS — F909 Attention-deficit hyperactivity disorder, unspecified type: Secondary | ICD-10-CM | POA: Diagnosis not present

## 2023-06-19 DIAGNOSIS — F320 Major depressive disorder, single episode, mild: Secondary | ICD-10-CM | POA: Diagnosis not present

## 2023-06-19 DIAGNOSIS — F4310 Post-traumatic stress disorder, unspecified: Secondary | ICD-10-CM | POA: Diagnosis not present

## 2023-06-19 DIAGNOSIS — F411 Generalized anxiety disorder: Secondary | ICD-10-CM | POA: Diagnosis not present

## 2023-07-03 DIAGNOSIS — F411 Generalized anxiety disorder: Secondary | ICD-10-CM | POA: Diagnosis not present

## 2023-07-03 DIAGNOSIS — F4310 Post-traumatic stress disorder, unspecified: Secondary | ICD-10-CM | POA: Diagnosis not present

## 2023-07-03 DIAGNOSIS — F909 Attention-deficit hyperactivity disorder, unspecified type: Secondary | ICD-10-CM | POA: Diagnosis not present

## 2023-07-11 DIAGNOSIS — Z419 Encounter for procedure for purposes other than remedying health state, unspecified: Secondary | ICD-10-CM | POA: Diagnosis not present

## 2023-07-31 DIAGNOSIS — F411 Generalized anxiety disorder: Secondary | ICD-10-CM | POA: Diagnosis not present

## 2023-07-31 DIAGNOSIS — F909 Attention-deficit hyperactivity disorder, unspecified type: Secondary | ICD-10-CM | POA: Diagnosis not present

## 2023-07-31 DIAGNOSIS — F4310 Post-traumatic stress disorder, unspecified: Secondary | ICD-10-CM | POA: Diagnosis not present

## 2023-08-07 ENCOUNTER — Ambulatory Visit: Payer: Self-pay

## 2023-08-07 DIAGNOSIS — L237 Allergic contact dermatitis due to plants, except food: Secondary | ICD-10-CM | POA: Diagnosis not present

## 2023-08-07 NOTE — Telephone Encounter (Signed)
 FYI Only or Action Required?: FYI only for provider.  Patient was last seen in primary care on New Patient.  Called Nurse Triage reporting Poison Ivy.  Symptoms began several days ago.  Interventions attempted: OTC medications: Ointment, Zyrtec, Benadryl.  Symptoms are: unchanged.  Triage Disposition: See Physician Within 24 Hours  Patient/caregiver understands and will follow disposition?: Yes   **New Patient appt. Is scheduled for 09/17/23  Patient was exposed to poison Ivy last Friday. She stated since Sunday she has developed symptoms. She has taken an Oat bath, Zyrtec and Benadryl, and ointment. The area is on her neck rib cage, face and arms. The areas has welts and reddened area.                  Copied from CRM [id number]. Topic: Clinical - Red Word Triage >> Aug 07, 2023 12:05 PM Chasity T wrote: [facility] that prompted transfer to Nurse Triage: patient has been expose to poison ivy and is in a lot of pain Reason for Disposition  MODERATE to SEVERE itching (e.g., interferes with work, school, sleep, or other activities)  Protocols used: Poison Ivy - [place] - [place]-[facility]

## 2023-08-10 DIAGNOSIS — Z419 Encounter for procedure for purposes other than remedying health state, unspecified: Secondary | ICD-10-CM | POA: Diagnosis not present

## 2023-09-10 DIAGNOSIS — Z419 Encounter for procedure for purposes other than remedying health state, unspecified: Secondary | ICD-10-CM | POA: Diagnosis not present

## 2023-09-17 ENCOUNTER — Ambulatory Visit: Admitting: Family Medicine

## 2023-10-11 DIAGNOSIS — Z419 Encounter for procedure for purposes other than remedying health state, unspecified: Secondary | ICD-10-CM | POA: Diagnosis not present

## 2023-11-05 DIAGNOSIS — F909 Attention-deficit hyperactivity disorder, unspecified type: Secondary | ICD-10-CM | POA: Diagnosis not present

## 2023-11-05 DIAGNOSIS — F4310 Post-traumatic stress disorder, unspecified: Secondary | ICD-10-CM | POA: Diagnosis not present

## 2023-11-05 DIAGNOSIS — F411 Generalized anxiety disorder: Secondary | ICD-10-CM | POA: Diagnosis not present

## 2023-11-07 ENCOUNTER — Ambulatory Visit: Admitting: Family Medicine

## 2023-11-19 ENCOUNTER — Ambulatory Visit (HOSPITAL_COMMUNITY)
Admission: EM | Admit: 2023-11-19 | Discharge: 2023-11-19 | Disposition: A | Discharge status: Home / Self Care | Attending: Psychiatry | Admitting: Psychiatry

## 2023-11-19 DIAGNOSIS — Z638 Other specified problems related to primary support group: Secondary | ICD-10-CM | POA: Insufficient documentation

## 2023-11-19 DIAGNOSIS — F69 Unspecified disorder of adult personality and behavior: Secondary | ICD-10-CM

## 2023-11-19 DIAGNOSIS — F909 Attention-deficit hyperactivity disorder, unspecified type: Secondary | ICD-10-CM | POA: Insufficient documentation

## 2023-11-19 DIAGNOSIS — F411 Generalized anxiety disorder: Secondary | ICD-10-CM | POA: Insufficient documentation

## 2023-11-19 DIAGNOSIS — Z560 Unemployment, unspecified: Secondary | ICD-10-CM | POA: Insufficient documentation

## 2023-11-19 DIAGNOSIS — Z59811 Housing instability, housed, with risk of homelessness: Secondary | ICD-10-CM | POA: Insufficient documentation

## 2023-11-19 DIAGNOSIS — F32A Depression, unspecified: Secondary | ICD-10-CM | POA: Insufficient documentation

## 2023-11-19 DIAGNOSIS — R4589 Other symptoms and signs involving emotional state: Secondary | ICD-10-CM | POA: Insufficient documentation

## 2023-11-19 NOTE — Discharge Instructions (Signed)
 Follow-up with resources provided and also follow-up with outpatient psychiatry
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
## 2023-11-19 NOTE — ED Provider Notes (Signed)
 [facility] Medical Screening Exam  Patient Name: Linda Martin MRN: [id number] Date of Evaluation: 11/19/23 Chief Complaint:   Diagnosis:  Final diagnoses:  Family discord  Behavior concern in adult    History of Present illness: Linda Martin is a 43 y.o. female.  With a history of ADHD, MDD, GAD.  Presented to [facility], voluntarily accompanied by her mother.  Per the patient her family wanted her to come here but she does not know why because she just think that she can talk to a counselor to hiring out family issues.  According to the patient she does see a psychiatrist for med management but [facility] (Dr. Frost MD), and she also sees a therapist at the same facility.  According to her she has been going through a lot she lost her job several months ago, she has been behind on her rent and is looking at getting evicted the next 2 weeks.  Her light was cut off but her mom paid a light bill.  And so she is raising a 42 year old daughter all by herself and has no resources.  Collaterals: Per patient's mother (Linda Martin), patient has been isolating herself and they are just concerned they think that she is abusing Adderall which is prescribed by her provider but they do not have any proof.  She also stated that she is behind on her rent she does not want to go get a job and they are tired of helping her financially and so they just think that something is wrong with her she needs a greater job.  During the interview both patient and mother keep going back at each other about was not supporting who and according to the patient her mom does not approve of her and always looked down on her even when she was in college she told her that she was not going to be successful.  Patient does seem to be having family dispute and discord and Clinical research associate discussed with both patient and mom the need for therapy they were also provided with resources to therapists.  Face-to-face evaluation of the  patient, patient is alert she is oriented x 4, speech is clear, maintain eye contact.  Patient does not show any sign of distress.  Denies SI, HI, AVH or paranoia.  Denies alcohol use.  Denies illicit drug use.  Present moment patient does not seem to be influenced by internal stimuli.  However patient seems to be having a hard time coping  financially.  At this present moment patient does not seem to be a risk to herself or others.  Patient is encouraged to call 911 or return to the nearest ED should she experience any suicidal thoughts homicidal ideation or hallucination.  Patient was provided with resources for outpatient therapy. Recommend discharge the patient to follow-up with her psychiatrist.  [facility] from 11/19/2023 in [facility]  C-SSRS RISK CATEGORY No Risk    Psychiatric Specialty Exam  Presentation  General Appearance:Casual  Eye Contact:Good  Speech:Clear and Coherent  Speech Volume:Normal  Handedness:Right   Mood and Affect  Mood: Anxious; Depressed  Affect: Appropriate   Thought Process  Thought Processes: Coherent  Descriptions of Associations:Intact  Orientation:Full (Time, Place and Person)  Thought Content:Logical    Hallucinations:None  Ideas of Reference:None  Suicidal Thoughts:No  Homicidal Thoughts:No   Sensorium  Memory: Immediate Fair  Judgment: Fair  Insight: Fair   Executive Functions  Concentration: Good  Attention Span: Good  Recall: Good  Fund of Knowledge: Good  Language: Good   Psychomotor Activity  Psychomotor Activity: Normal   Assets  Assets: Desire for Improvement; Social Support   Sleep  Sleep: Fair  Number of hours:  7   Physical Exam: Physical Exam ROS Blood pressure (!) 139/91, pulse 89, resp. rate 16, SpO2 99%. There is no height or weight on file to calculate BMI.  Musculoskeletal: Strength & Muscle Tone: within normal limits Gait &  Station: normal Patient leans: N/A   [facility] MSE Discharge Disposition for Follow up and Recommendations: Based on my evaluation the patient does not appear to have an emergency medical condition and can be discharged with resources and follow up care in outpatient services for Individual Therapy   Gaither Pouch, NP 11/19/2023, 10:32 PM

## 2023-12-12 DIAGNOSIS — F4310 Post-traumatic stress disorder, unspecified: Secondary | ICD-10-CM | POA: Diagnosis not present

## 2023-12-12 DIAGNOSIS — F320 Major depressive disorder, single episode, mild: Secondary | ICD-10-CM | POA: Diagnosis not present

## 2023-12-12 DIAGNOSIS — F909 Attention-deficit hyperactivity disorder, unspecified type: Secondary | ICD-10-CM | POA: Diagnosis not present

## 2023-12-12 DIAGNOSIS — F411 Generalized anxiety disorder: Secondary | ICD-10-CM | POA: Diagnosis not present

## 2023-12-30 ENCOUNTER — Ambulatory Visit: Admitting: Family Medicine

## 2024-01-21 DIAGNOSIS — F411 Generalized anxiety disorder: Secondary | ICD-10-CM | POA: Diagnosis not present

## 2024-01-21 DIAGNOSIS — F909 Attention-deficit hyperactivity disorder, unspecified type: Secondary | ICD-10-CM | POA: Diagnosis not present

## 2024-01-21 DIAGNOSIS — F4310 Post-traumatic stress disorder, unspecified: Secondary | ICD-10-CM | POA: Diagnosis not present
